# Patient Record
Sex: Female | Born: 1950 | Race: White | Hispanic: No | Marital: Married | Smoking: Former smoker
Health system: Southern US, Community
[De-identification: ages and names within clinical notes are randomized; demographics above are authoritative.]

## PROBLEM LIST (undated history)

## (undated) DIAGNOSIS — M797 Fibromyalgia: Secondary | ICD-10-CM

## (undated) DIAGNOSIS — Z8719 Personal history of other diseases of the digestive system: Secondary | ICD-10-CM

## (undated) DIAGNOSIS — F32A Depression, unspecified: Secondary | ICD-10-CM

## (undated) DIAGNOSIS — E0500 Thyrotoxicosis with diffuse goiter without thyrotoxic crisis or storm: Secondary | ICD-10-CM

## (undated) DIAGNOSIS — E669 Obesity, unspecified: Secondary | ICD-10-CM

## (undated) DIAGNOSIS — F329 Major depressive disorder, single episode, unspecified: Secondary | ICD-10-CM

## (undated) DIAGNOSIS — I499 Cardiac arrhythmia, unspecified: Secondary | ICD-10-CM

## (undated) DIAGNOSIS — IMO0001 Reserved for inherently not codable concepts without codable children: Secondary | ICD-10-CM

## (undated) DIAGNOSIS — I10 Essential (primary) hypertension: Secondary | ICD-10-CM

## (undated) DIAGNOSIS — R7303 Prediabetes: Secondary | ICD-10-CM

## (undated) DIAGNOSIS — N390 Urinary tract infection, site not specified: Secondary | ICD-10-CM

## (undated) DIAGNOSIS — I639 Cerebral infarction, unspecified: Secondary | ICD-10-CM

## (undated) DIAGNOSIS — A809 Acute poliomyelitis, unspecified: Secondary | ICD-10-CM

## (undated) DIAGNOSIS — G4730 Sleep apnea, unspecified: Secondary | ICD-10-CM

## (undated) DIAGNOSIS — E039 Hypothyroidism, unspecified: Secondary | ICD-10-CM

## (undated) DIAGNOSIS — J45909 Unspecified asthma, uncomplicated: Secondary | ICD-10-CM

## (undated) DIAGNOSIS — E7800 Pure hypercholesterolemia, unspecified: Secondary | ICD-10-CM

## (undated) DIAGNOSIS — F419 Anxiety disorder, unspecified: Secondary | ICD-10-CM

## (undated) HISTORY — DX: Thyrotoxicosis with diffuse goiter without thyrotoxic crisis or storm: E05.00

## (undated) HISTORY — PX: ABDOMINAL HYSTERECTOMY: SHX81

## (undated) HISTORY — PX: REPLACEMENT TOTAL KNEE: SUR1224

## (undated) HISTORY — DX: Personal history of other diseases of the digestive system: Z87.19

## (undated) HISTORY — PX: APPENDECTOMY: SHX54

## (undated) HISTORY — PX: KNEE ARTHROPLASTY: SHX992

## (undated) HISTORY — PX: BREAST SURGERY: SHX581

## (undated) HISTORY — DX: Unspecified asthma, uncomplicated: J45.909

## (undated) HISTORY — PX: TOTAL THYROIDECTOMY: SHX2547

## (undated) HISTORY — PX: CHOLECYSTECTOMY: SHX55

## (undated) HISTORY — DX: Fibromyalgia: M79.7

## (undated) HISTORY — DX: Acute poliomyelitis, unspecified: A80.9

## (undated) HISTORY — PX: URETHRAL CYST REMOVAL: SHX5128

## (undated) HISTORY — DX: Pure hypercholesterolemia, unspecified: E78.00

---

## 2000-01-29 ENCOUNTER — Encounter: Admission: RE | Admit: 2000-01-29 | Discharge: 2000-04-28 | Payer: Self-pay | Admitting: Anesthesiology

## 2001-07-06 ENCOUNTER — Encounter: Payer: Self-pay | Admitting: Specialist

## 2001-07-06 ENCOUNTER — Ambulatory Visit (HOSPITAL_COMMUNITY): Admission: RE | Admit: 2001-07-06 | Discharge: 2001-07-06 | Payer: Self-pay | Admitting: Specialist

## 2001-07-14 ENCOUNTER — Encounter: Payer: Self-pay | Admitting: Specialist

## 2001-07-16 ENCOUNTER — Ambulatory Visit (HOSPITAL_COMMUNITY): Admission: RE | Admit: 2001-07-16 | Discharge: 2001-07-16 | Payer: Self-pay | Admitting: Specialist

## 2004-07-30 ENCOUNTER — Ambulatory Visit (HOSPITAL_COMMUNITY): Admission: RE | Admit: 2004-07-30 | Discharge: 2004-07-30 | Payer: Self-pay | Admitting: General Surgery

## 2005-05-10 ENCOUNTER — Ambulatory Visit (HOSPITAL_COMMUNITY): Admission: RE | Admit: 2005-05-10 | Discharge: 2005-05-10 | Payer: Self-pay | Admitting: *Deleted

## 2005-06-17 ENCOUNTER — Inpatient Hospital Stay (HOSPITAL_COMMUNITY): Admission: RE | Admit: 2005-06-17 | Discharge: 2005-06-20 | Payer: Self-pay | Admitting: Orthopedic Surgery

## 2005-07-08 ENCOUNTER — Encounter: Admission: RE | Admit: 2005-07-08 | Discharge: 2005-08-04 | Payer: Self-pay | Admitting: Orthopedic Surgery

## 2005-08-05 ENCOUNTER — Encounter: Admission: RE | Admit: 2005-08-05 | Discharge: 2005-08-05 | Payer: Self-pay | Admitting: Orthopedic Surgery

## 2007-07-21 ENCOUNTER — Encounter (HOSPITAL_COMMUNITY): Admission: RE | Admit: 2007-07-21 | Discharge: 2007-08-20 | Payer: Self-pay | Admitting: Family Medicine

## 2007-11-11 ENCOUNTER — Encounter (HOSPITAL_COMMUNITY): Admission: RE | Admit: 2007-11-11 | Discharge: 2007-12-11 | Payer: Self-pay | Admitting: Endocrinology

## 2009-06-26 ENCOUNTER — Encounter: Admission: RE | Admit: 2009-06-26 | Discharge: 2009-06-26 | Payer: Self-pay | Admitting: Orthopedic Surgery

## 2009-07-02 ENCOUNTER — Encounter: Admission: RE | Admit: 2009-07-02 | Discharge: 2009-07-02 | Payer: Self-pay | Admitting: Orthopedic Surgery

## 2010-06-01 NOTE — Procedures (Signed)
[facility]  Patient:    Michelle Roth, Michelle Roth                       MRN: [id number] Proc. Date: 02/11/00 Adm. Date:  [id number] Attending:  Thyra Breed CC:         Reinaldo Meeker, M.D.  Patrica Duel, M.D.   Procedure Report  PROCEDURE:  Lumbar epidural steroid injection.  DIAGNOSIS:  Lumbar radiculopathy with underlying lumbar degenerative disk disease and facet joint arthropathy.  INTERVAL HISTORY:  The patients noted that her back discomfort is improving with her first injection. She rates her pain at 6/10. She tolerated the injection well but did develop a global sense of weakness following the injection, and since her first injection has noted a cystic swelling over the dorsum of her left foot.  PHYSICAL EXAMINATION:  Blood pressure 128/55, heart rate 77, respiratory rate 18, O2 saturations 98%, and pain level is 6/10. She shows good healing over her previous injection site. She appears to have a nodular cystic type swelling over the dorsum of her left foot suggestive of a ganglion cyst.  DESCRIPTION OF PROCEDURE:  After informed consent was obtained, the patient was placed in the sitting position and monitored. The patients back was prepped with Betadine x 3. A skin wheal was raised at the L4-5 interspace with 1 percent lidocaine. A 20 gauge Tuohy needle was introduced to the lumbar epidural space to loss of resistance to preservative free normal saline. There was no cerebrospinal fluid nor blood. 40 mg of Medrol and 6 ml of preservative free normal saline was gently injected. The needle was flushed with preservative free normal saline and removed intact.  CONDITION POST PROCEDURE:  Stable.  DISCHARGE INSTRUCTIONS:  Resume previous diet. Limitations in activities per instruction sheet. Continue on current medications. Follow-up with me in 1-2 weeks for a third injection. DD:  02/11/00 TD:  02/11/00 Job: [id number]

## 2010-06-01 NOTE — Consult Note (Signed)
[facility]  Patient:    Michelle Roth, Michelle Roth                       MRN: [id number] Proc. Date: 03/19/00 Adm. Date:  [id number] Attending:  Thyra Breed CC:         Dr. Elisabeth Most, M.D.                          Consultation Report  FOLLOW-UP EVALUATION:  Michelle Roth comes in for a follow-up evaluation today. She saw Dr. Gerlene Fee earlier who recommended surgical intervention since she has not had any lasting benefits from the block therapy. I advised her that she has had less than a month worth of benefit from the intra-articular facet blocks and it likely is not going to hold and that she should seriously consider surgical intervention. She is quite adamant about not pursuing this. She tells me that her insurance will be running out and she is reluctant to proceed with this. She is nervous and she states she has a catch in her left hip. She has had her Xanax increased and her Zoloft since her last evaluation. She rates her pain at 6/10. She is very anxious and unsettled today. She complains of pain predominantly in the left buttock and thigh region. It is made worse by standing, bending, walking, sitting in the same position for more than five minutes and improved by lying on her left side with a pillow between her legs.  CURRENT MEDICATIONS:  Zoloft 150 mg per day. Xanax 1 mg q. 6h. Ziac 2.5 mg per day. Zantac, Alphagan eyedrops, Travatan eyedrops.  PHYSICAL EXAMINATION:  Blood pressure 155/94, heart rate is 93, respiratory rate 18, O2 saturations 97%, pain level is 6/10. The patient is very anxious today and fidgety. Hyperextension of her back to 15 degrees increases the discomfort on the left lower facet regions, forward flexion to almost 90 degrees reduces her discomfort. Straight leg raise signs are negative. Deep tendon reflexes were 1+ a the knees, 2+ at the ankles.  IMPRESSION: 1. Low back pain with lumbar radiculopathy  with underlying lumbar spondylosis    and degenerative disk disease nonresponsive to epidural steroid injection    with really limited response to intra-articular facet joint injection. 2. Other medical problems per Dr. Nobie Putnam.  DISPOSITION: 1. I advised the patient that she should seriously reconsider surgical    intervention since the blocks have not been of great benefit. As an    alternative, I advised her she could try Ultram 50 mg one p.o. to    two p.o. q. 6h p.r.n. She was given a prescription for 100 with four    refills. She was made aware of the side effects of this. In addition, she    could try apple cider vinegar with honey since she is allergic to the    aspirin products with asthma and hives. 2. I advised her that she might benefit from doing Robin McKinseys exercises    and gave her a list of books on these. 3. I offered to see her in follow-up but she is worried about the cost so I    I left it open. She is more than welcome to return to [place] but I really    encouraged her to seriously consider surgical intervention. She stated    she  wants a 100% guarantee that the surgery will be of some benefit and    I advised her that nobody could do that. She stated that she is in the    process of applying for disability.DD:  03/19/00 TD:  03/20/00 Job: [id number]

## 2010-06-01 NOTE — H&P (Signed)
NAME:  Michelle Roth, Michelle Roth                ACCOUNT NO.:  [id number]   MEDICAL RECORD NO.:  [id number]           PATIENT TYPE:  AMB   LOCATION:                                FACILITY:  [facility]   PHYSICIAN:  Dalia Heading, M.D.  DATE OF BIRTH:  [birth year]   DATE OF ADMISSION:  DATE OF DISCHARGE:  LH                                HISTORY & PHYSICAL   CHIEF COMPLAINT:  Need for screening colonoscopy.   HISTORY OF PRESENT ILLNESS:  The patient is a 60 year old white female who  is referred for endoscopic evaluation. She needs colonoscopy for screening  purposes. No abdominal pain, weight loss, nausea, vomiting, diarrhea,  constipation, melena, or hematochezia has been noted.   PAST MEDICAL HISTORY:  Depression.   PAST SURGICAL HISTORY:  1.  Hysterectomy.  2.  Urethral cyst removal.  3.  Left breast biopsy.  4.  Cholecystectomy.   CURRENT MEDICATIONS:  Xanax, Zoloft, Ziac, Aleve, tramadol.   ALLERGIES:  ASPIRIN, BIAXIN.   REVIEW OF SYSTEMS:  Noncontributory.   PHYSICAL EXAMINATION:  GENERAL:  The patient is a well-developed, well-  nourished, white female in no acute distress. She is afebrile and vital  signs are stable.  LUNGS:  Clear to auscultation with equal breath sounds bilaterally.  HEART:  Reveals a regular rate and rhythm without S3, S4, or murmurs.  ABDOMEN:  Soft, nontender, nondistended. No hepatosplenomegaly or masses are  noted.  RECTAL:  Deferred to the procedure.   IMPRESSION:  Need for screening colonoscopy.   PLAN:  The patient is scheduled for colonoscopy on July 30, 2004. The risks  and benefits of the procedure including bleeding and perforation were fully  explained to the patient who gave informed consent.       MAJ/MEDQ  D:  07/26/2004  T:  07/26/2004  Job:  [id number]   cc:   [facility]  Fax: [phone]   Patrica Duel, M.D.  [address], [address]  [place]  [place] [address]  Fax: [phone]

## 2010-06-01 NOTE — Op Note (Signed)
[facility]  Patient:    Michelle Roth, Michelle Roth                       MRN: [id number] Proc. Date: 02/21/00 Adm. Date:  [id number] Attending:  Thyra Breed CC:         Patrica Duel, M.D.  Reinaldo Meeker, M.D.   Operative Report  PROCEDURE:  Facet joint injections at L3-4, L4-5, and L5-S1 on the left side.  DIAGNOSIS:  Lumbar spondylosis with underlying low back discomfort with a facet joint syndrome.  ANESTHESIOLOGIST:  Thyra Breed, M.D.  INTERVAL HISTORY: The patient has noted no improvement with epidurals x 2. She spoke with Dr. Gerlene Fee yesterday, and he encouraged her to go ahead and proceed with facet joint block.  She presents today for that.  I spoke with her about it on her last visit and reviewed the potential risks and benefits of it.  PHYSICAL EXAMINATION:  VITAL SIGNS:  Blood pressure 164/85, heart rate 75, respiratory rate 20, O2 saturation 97%, pain level 5/10.  GENERAL:  Her exam is unchanged from her last visit.  PROCEDURE:  After informed consent was obtained, the patient was placed on the fluoroscopy table in the prone position with a pillow under her abdomen. Monitors were placed.  Using fluoroscopic guidance, I optimized visualization of the L3-4, L4-5, and L5-S1 facet joints on the left side.  The skin overlying these areas was marked and prepped with Betadine x 3.  I draped out the area.  Using a 25-gauge needle, I anesthetized the skin and subcutaneous structures with 2 cc of 1% lidocaine at each level, and 22-gauge Chiba needles were introduced down to the facet joints at each level confirmed by AP, lateral, and oblique projections.   Aspiration was negative.  I injected 0.5 cc of 1% lidocaine at each level.  There was no evidence of a spinal block.  Medrol 13 mg and 0.5 cc of 1% lidocaine was injected at each level, and the needles were flushed with preservative free normal saline.  The needle was removed intact at each  level.  Fifteen minutes after the procedure, the patient noted marked reduction in her pain.  She stated that it was brought on by bending over and lifting up objects, so I placed a can on the floor.  She bent over and lifted it up and stated that she was markedly improved.  I advised the patient that it appears that most of her discomfort is coming from these three facet joints.  I advised her that I would see her back in followup in four weeks.  If she was having recurrence of her pain, we would go ahead and do nerve blocks to the facet joints with consideration for possible radiofrequency lesioning of these nerves versus a repeat intra-articular injection.  She is to continue on her current medications. DD:  02/21/00 TD:  02/22/00 Job: [id number]

## 2010-06-01 NOTE — Op Note (Signed)
[facility]  Patient:    Michelle Roth, Michelle Roth Visit Number: [id number] MRN: [id number]          Service Type: DSU Location: DAY Attending Physician:  Rocky Crafts Dictated by:   Michael Litter. Montez Morita, M.D. Proc. Date: 07/16/01 Admit Date:  07/16/2001 Discharge Date: 07/16/2001                             Operative Report  PREOPERATIVE DIAGNOSIS:  Torn medial meniscus with degenerative arthritis, right knee.  POSTOPERATIVE DIAGNOSIS:  Torn medial meniscus with degenerative arthritis, right knee.  OPERATION PERFORMED:  Partial medial meniscectomy.  PROCEDURE:  After suitable general anesthesia, the upper thigh tourniquet was inflated to 400 mmHg and then she is placed into an arthroscopy leg holder and prepped and draped routinely. Arthroscopy is carried out with an anterolateral portal introducing the scope. Once into the joint, the fluid is hooked up to it and introduced up into the suprapatellar pouch. The pouch revealed some mild chondromalacia. Coming to the medial joint, there is extensive chondromalacia degenerative changes of the medial femoral condyle and medial tibial plateau and a significant tearing of the medial meniscus. It is a vertical cleavage tear with secondary tears on either side. This is then trimmed back with a combination of 3.5, 4.2 rotary meniscotomes and a straight punch to a nice smooth edge of meniscus. At the end of the procedure, 20 cc of 0.5% Marcaine with adrenaline into the knee. The two puncture wounds were sutured with nylon and compression dressing. The tourniquet was released and she goes to recovery in good condition. Dictated by:   [employer] Montez Morita, M.D. Attending Physician:  Rocky Crafts DD:  07/16/01 TD:  07/20/01 Job: [id number]

## 2010-06-01 NOTE — H&P (Signed)
[facility]  Patient:    Michelle Roth, Michelle Roth                       MRN: [id number] Adm. Date:  [id number] Attending:  Thyra Breed CC:         Reinaldo Meeker, M.D.  Dr. _________   History and Physical  FOLLOW-UP EVALUATION:  Nasreen comes in for follow-up evaluation of her chronic low back pain. She has had two lumbar epidural steroid injections and notes no overall improvement. She has not been back to see Reinaldo Meeker, M.D. yet. She complains of pain predominantly in the lower back and to a lesser extent the numbness and tingling out into the left lower extremity. She notes with activities this is increased.  PHYSICAL EXAMINATION:  VITAL SIGNS:  Blood pressure 139/52, heart rate is 74, respiratory rate is 20, O2 saturation is 96%, temperature is 97.6, pain level is 5/10.  NEUROLOGICAL:  Her deep tendon reflexes in the lower extremities revealed 2+ at the right knee, 0 to 1+ at the left knee, 2+ at the ankles with negative straight leg raise signs. She exhibited increased discomfort in her left lower lumbar regions to hyperextension of her back but reduced with forward flexion.  IMPRESSION: 1. Low back pain with underlying lumbar radiculopathy into the left lower    extremity with underlying lumbar spondylosis and degenerative disk disease    with no improvement to epidural steroid injections. 2. Other medical problems per Dr. ______ .  DISPOSITION: 1. I encouraged the patient to follow up with Reinaldo Meeker, M.D. to find    out whether he was going to do any further diagnostic studies. 2. I offered facet joint injections to the patient if Reinaldo Meeker, M.D.    concurs and will see her back at Dr. Allyson Sabal request. DD:  02/18/00 TD:  02/18/00 Job: [id number]

## 2010-06-01 NOTE — Discharge Summary (Signed)
Michelle Roth, Michelle Roth                ACCOUNT NO.:  [id number]   MEDICAL RECORD NO.:  [id number]          PATIENT TYPE:  INP   LOCATION:  1504                         FACILITY:  [facility]   PHYSICIAN:  Ollen Gross, M.D.    DATE OF BIRTH:  [birth year]   DATE OF ADMISSION:  06/17/2005  DATE OF DISCHARGE:  06/20/2005                                 DISCHARGE SUMMARY   ADMISSION DIAGNOSES:  1. Osteoarthritis, left knee.  2. History of irritable bowel syndrome.  3. History of panic disorder.  4. Anxiety.  5. History of asthma.  6. History of bronchitis.  7. Hypertension.  8. Hiatal hernia.  9. Gastroesophageal reflux disease.  10.History of urinary tract infections.  11.History of skin cancer.  12.Degenerative disk disease.  13.History of hypoglycemia.  14.Obesity.  15.Hemorrhoids.   DISCHARGE DIAGNOSES:  1. Osteoarthritis, left knee, status post left total knee angioplasty.  2. Acute blood loss anemia, did not require transfusion.  3. History of irritable bowel syndrome.  4. History of panic disorder.  5. Anxiety.  6. History of asthma.  7. History of bronchitis.  8. Hypertension.  9. Hiatal hernia.  10.Gastroesophageal reflux disease.  11.History of urinary tract infections.  12.History of skin cancer.  13.Degenerative disk disease.  14.History of hypoglycemia.  15.Obesity.  16.Hemorrhoids.   PROCEDURES:  June 17, 2005, left total knee angioplasty.  Surgeon: Ollen Gross, M.D.  Assistance: Alexzandrew L. Perkins, P.A.-C.  Anesthesia:  General.  Postop Marcaine pain pump.   CONSULTATIONS:  None.   BRIEF HISTORY:  Michelle Roth is a 60 year old female with severe end-stage  osteoarthritis of the left knee with intractable pain, failed conservative  management, now presents for total knee.   LABORATORY DATA:  Preop CBC: Hemoglobin 12.5, hematocrit 37.5, white cell  count 6.2.  Postop hemoglobin 10.7, dropped down to 9.9.  Last hemoglobin  9.4, hematocrit 27.  PT/PTT 13.2  and 29, respectively, INR 1.0.  Serial pro  times followed.  PT 20.5, INR 1.7.  Chemistries on admission all within  normal limits with the exception of albumin of 3.4.  Serial albumin's were  followed.  Electrolytes remained within normal limits.  Preop UA: Moderate  leukocyte esterase, rare epithelials, 11-20 white cells, trace hemoglobin  treated perioperatively.  Blood type A positive.   EKG dated May 10, 2005: Sinus rhythm, unable to read signature.   HOSPITAL COURSE:  Admitted to [facility]  Tolerated procedure  well.  Later transferred to the recovery room then orthopedic floor. Given  24 hours postop IV antibiotic, started on PCA for pain control following  surgery and supplemented with p.o. medications, and given Marcaine pain  pump.  On the morning of day #1, was doing pretty well, although not getting  much sleep.  Hemovac drain placed during surgery was pulled on day #1.   Started to get her out of bed with physical therapy by day #2.  The patient  was doing a little bit better.  She had some intermittent nausea on the  evening of day #1 which was improved by day #2.  Dressing was changed;  incision looked good.  Hemoglobin dropped down to 9.5, but she was  asymptomatic with it.  Started getting up with physical therapy and  ambulating 200 feet that day.  Was doing so well by the following day, she  continued with her therapy, and was discharged home on June 20, 2005.   DISCHARGE PLAN:  Discharged home on June 20, 2005   For Discharge Diagnoses, please see above.   DISCHARGE MEDICATIONS:  Coumadin, Vicodin, Robaxin, and 1 Lovenox dose prior  to discharge.   DIET:  Regular.   FOLLOW UP:  In 2 weeks.   ACTIVITY:  Home health PT, home health nursing with total knee protocol.   DISPOSITION:  Home.   CONDITION ON DISCHARGE:  Improved.      Alexzandrew L. Julien Girt, P.A.      Ollen Gross, M.D.  Electronically Signed    ALP/MEDQ  D:  08/13/2005  T:   08/13/2005  Job:  [id number]   cc:   Patrica Duel, M.D.  Fax: [phone]   Dani Gobble, MD  Fax: [phone]

## 2010-06-01 NOTE — Op Note (Signed)
[facility]  Patient:    Michelle Roth, Michelle Roth                       MRN: [id number] Proc. Date: 01/30/00 Adm. Date:  [id number] Attending:  Thyra Breed CC:         Reinaldo Meeker, M.D.  Patrica Duel, M.D.   Operative Report  PROCEDURE:  Lumbar epidural steroid injection.  DIAGNOSES:  Lumbar radiculopathy with underlying lumbar degenerative disk disease and facet joint arthritis.  ANESTHESIOLOGIST:  Thyra Breed, M.D.  HISTORY:  Michelle Roth is a very pleasant 60 year old who was sent to [place] by Dr. Aliene Beams for a series of two lumbar epidural steroid injections.  The patient stated that she was in her usual state of health up until about 10 years ago when she was lifting a 5-gallon bucket of water and noted a pop in her back.  She was treated by a chiropractor, Dr. Rhoderick Moody, in [place] and did well.  Approximately two years ago, she noted the insidious onset of numbness and tingling to her left lower extremity and lower back discomfort which she characterizes as sharp discomfort which eventually evolves into a throbbing, burning pain radiating out to the left lower extremity along the hip and into the anterior thigh.  It is made worse by activity and improved by rest, especially lying down with her body rolled into a fetal position with pillows between her knees.  She was initially treated for this by a chiropractor in [place], Dr. Lawerance Sabal, with no improvement and eventually saw Dr. ______ but had no sustained improvement.  She saw Dr. Patrica Duel in [place], her primary care physician, who obtained an MRI and sent her to Dr. Gerlene Fee.  Dr. Gerlene Fee has recommended that she go through a series of epidurals.  She does note the numbness and tingling as outlined above.  She has intermittent weakness of her left lower extremity but denied any bowel or bladder incontinence.  Her pain is made worse by exercising, walking, sitting, standing, or  palpation over her back.  It is sometimes helped by ice and sleep.  She is currently on Zoloft 100 mg per day, Xanax 1 mg t.i.d. p.r.n. for panic disorder, hydrochlorothiazide, Alphagan and bisoprolol for glaucoma, and Zantac for gastroesophageal reflux.  ALLERGIES:  ASPIRIN and NONSTEROIDALS.  FAMILY HISTORY:  Positive for osteoarthritis.  PAST SURGICAL HISTORY:  Significant for hysterectomy, periurethral cyst resection, gallbladder surgery with appendectomy, and lumpectomy.  ACTIVE MEDICAL PROBLEMS:  Include panic disorder, glaucoma, gastroesophageal reflux disease, irritable bowel syndrome, asthma, and dust mite allergy.  REVIEW OF SYSTEMS:  General: Negative.  Head: Significant for tension headaches.   Eyes: Significant for glaucoma.  Nose, Mouth, Throat: Significant for allergies. Ears: Negative.  Pulmonary: Significant for asthma. Cardiovascular: Negative.  GI: See Active Medical Problems.   GU: Negative. Musculoskeletal: See HPI.  The patient also has some shoulder and knee discomfort.  Neurologic: See HPI.  No history of seizure or stroke. Cutaneous: Negative.  Hematologic: Negative.  Endocrine: Negative. Psychiatric: Significant for panic disorder.  Allergy and Immunologic: Positive for dust mite allergy.  PHYSICAL EXAMINATION:  VITAL SIGNS:  Blood pressure 152/73, heart rate 69, respiratory rate 18, O2 saturation 99%.  Pain level is 7/10.  Temperature 97.9.  GENERAL:  This is a pleasant female in no acute distress.  HEENT:  Head was normocephalic, atraumatic.  Eyes: Extraocular movements intact with conjunctivae and sclerae clear.  Nose: Patent nares without discharge.  Oropharynx free of lesions.  NECK:  Supple without lymphadenopathy.  Carotids 2+ and symmetric without bruits.  LUNGS:  Clear.  HEART:  Regular rate and rhythm.  BREASTS/ABDOMEN/PELVIC/RECTAL:  Exams not performed.  BACK:  Minimal tenderness to palpation over her lower back.  Straight leg raise  signs were negative.  EXTREMITIES:   Radial pulses and dorsalis pedis pulses 2+ and symmetric.  She has some mild varicosities of lower extremities.  NEUROLOGIC:  The patient is oriented x 4.  Cranial nerves II-XII grossly intact.  Deep tendon reflexes were symmetric in the upper extremity, 2+ at the right knee, 0 to 1+ at the left knee, 2+ at the ankles with downgoing toes. Motor was 5/5 with symmetric bulk and tone.  Coordination was grossly intact. Sensory was intact to pinprick, light touch, and vibratory sense.  IMPRESSION: 1. Low back pain with lumbar radiculopathy into the left lower extremity with    underlying lumbar spondylosis and degenerative disk disease.  I do not have    a copy of the interpretation of her MRI or her MRI for review. 2. Other medical problems per Dr. Nobie Putnam which include gastroesophageal    reflux disease, irritable bowel syndrome, glaucoma, panic attacks, dust    mite allergy, and asthma.  DISPOSITION:  I discussed the potential benefits, limitations, and alternatives to an epidural steroid injection with the patient as well as with her husband.  Their questions were answered.  PROCEDURE:  After informed consent was obtained, the patient was placed in the sitting position and monitored.  Her back was prepped with Betadine x 3.  A skin wheal was raised at the L4-5 interspace with 1% lidocaine.  A 20-gauge Tuohy needle was introduced in the lumbar epidural space to loss of resistance to preservative free normal saline.  There was no CSF nor blood.  Medrol 40 mg and 8 ml preservative free normal saline was gently injected.  The needle was flushed with preservative free normal saline and removed intact.  POSTPROCEDURE CONDITION:  Stable.  DISCHARGE INSTRUCTIONS: 1. Resume previous diet. 2. Limitation of activities per instruction sheet. 3. Continue on current medications.  4. Follow up with me in one to two weeks for repeat injection.DD:   01/30/00 TD:  01/31/00 Job: [id number]

## 2010-06-01 NOTE — H&P (Signed)
NAME:  Michelle Roth, Michelle Roth                ACCOUNT NO.:  [id number]   MEDICAL RECORD NO.:  [id number]          PATIENT TYPE:  INP   LOCATION:  NA                           FACILITY:  [facility]   PHYSICIAN:  Ollen Gross, M.D.    DATE OF BIRTH:  07/28/1950   DATE OF ADMISSION:  06/17/2005  DATE OF DISCHARGE:                                HISTORY & PHYSICAL   DATE OF OFFICE VISIT HISTORY AND PHYSICAL:  Jun 11, 2005   CHIEF COMPLAINT:  Left knee pain.   HISTORY OF PRESENT ILLNESS:  The patient is a 60 year old female who has  been seen by Dr. Lequita Halt for ongoing left knee pain.  She has been treated  by Dr. Francena Hanly in the past with an arthroscopy and did not help very  much with her pain; she has had pain for several years now.  She has  undergone Hyalgan injections which did not help; her knee is hurting her  more and more, even at night.  She is at a stage now where she would like to  have something done about it.  X-rays show end-stage arthritis.  She is felt  it would benefit by undergoing knee replacement, risks and benefits  discussed; the patient is subsequently admitted to the hospital.   ALLERGIES:  BIAXIN causes thrush.  ASPIRIN causes hives.  Questionable  reaction to CORTISONE SHOT, which caused hives.   CURRENT MEDICATIONS:  Xanax, Zoloft, Ziac, Aleve, tramadol, Desyrel.   PAST MEDICAL HISTORY:  1.  Anxiety.  2.  History of panic disorder.  3.  History of asthma.  4.  History of bronchitis.  5.  History of hypertension.  6.  Hiatal hernia.  7.  Reflux disease.  8.  History of hemorrhoids.  9.  History of UTIs.  10. Skin cancer.  11. Degenerative disk disease.  12. Hypoglycemia.  13. She has also had a vagal nerve response which sounds like a hypotensive      episode following colonoscopy.   PAST SURGICAL HISTORY:  1.  Hysterectomy.  2.  Periurethral cyst removed.  3.  Left breast cyst excision.  4.  Gallbladder.  5.  Colonoscopy.  6.  Excision of skin  cancer.   SOCIAL HISTORY:  Married, retired, on disability, nonsmoker, no alcohol, 2  children.  Husband, mother and son will be assisting with care after  surgery.   REVIEW OF SYSTEMS:  GENERAL:  No fevers, chills or night sweats.  NEUROLOGIC:  She does have anxiety and panic disorder.  No seizure, syncope  or paralysis.  RESPIRATORY:  She does get a little shortness of breath on  exertion, but no shortness of breath at rest, no productive cough or  hemoptysis.  CARDIOVASCULAR:  No chest pain, angina or orthopnea.  GI:  No  nausea, vomiting, diarrhea or constipation.  She does have history of IBS.  GU:  No history of hematuria or discharge.  MUSCULOSKELETAL:  Left knee.   PHYSICAL EXAMINATION:  VITAL SIGNS:  Pulse 84, respirations 12, blood  pressure 162/95.  GENERAL:  [age]-year-old white female, well-nourished, well-developed,  obese/overweight, in no acute distress, awake, alert, oriented and  cooperative, very pleasant, accompanied by her husband.  HEENT:  Normocephalic, atraumatic.  Pupils round and reactive.  No glasses.  EOMs intact.  NECK:  Supple.  No carotid bruits.  CHEST:  Clear, anterior and posterior chest walls.  HEART:  Regular rate and rhythm, no murmur, S1 and S2.  ABDOMEN:  Soft, round, protuberant abdomen.  Bowel sounds present and  reactive.  GENITALIA:  Not done, not pertinent to present illness.  EXTREMITIES:  Left knee:  Varus deformity, malalignment and marked crepitus  are noted.  Range of motion of 10-100 degrees, no instability.   IMPRESSION:  1.  Osteoarthritis, left knee.  2.  History of irritable bowel syndrome.  3.  History of panic disorder.  4.  Anxiety.  5.  History of asthma.  6.  History of bronchitis.  7.  Hypertension.  8.  Hiatal hernia.  9.  Gastroesophageal reflux disease.  10. History of urinary tract infections.  11. History of skin cancer.  12. Degenerative disk disease.  13. History of hypoglycemia.  14. Obesity.  15.  Hemorrhoids.   PLAN:  The patient will be admitted to [facility] to undergo a  left total knee arthroplasty; surgery will be performed by Dr. Ollen Gross.  She has been seen by Dr. Kem Boroughs preoperatively and  undergone a recent stress test.  Please note at the time of this dictation  that the stress test results and clearance were pending.      Alexzandrew L. Julien Girt, P.A.      Ollen Gross, M.D.  Electronically Signed    ALP/MEDQ  D:  06/16/2005  T:  06/17/2005  Job:  [id number]   cc:   Patrica Duel, M.D.  Fax: [phone]   Dani Gobble, MD  Fax: [phone]

## 2010-06-01 NOTE — Op Note (Signed)
NAME:  Michelle Roth, Michelle Roth                ACCOUNT NO.:  [id number]   MEDICAL RECORD NO.:  [id number]          PATIENT TYPE:  INP   LOCATION:  1504                         FACILITY:  [facility]   PHYSICIAN:  Ollen Gross, M.D.    DATE OF BIRTH:  30-Dec-1950   DATE OF PROCEDURE:  06/17/2005  DATE OF DISCHARGE:                                 OPERATIVE REPORT   PREOPERATIVE DIAGNOSIS:  Osteoarthritis left knee.   POSTOPERATIVE DIAGNOSIS:  Osteoarthritis left knee.   PROCEDURE:  Left total knee arthroplasty.   SURGEON:  Ollen Gross, M.D.   ASSISTANT:  Alexzandrew L. Julien Girt, P.A.   ANESTHESIA:  General with postop Marcaine pain pump.   ESTIMATED BLOOD LOSS:  Minimal.   DRAIN:  Hemovac x1.   TOURNIQUET TIME:  51 minutes at 300 mmHg.   COMPLICATIONS:  None.   CONDITION:  Stable to recovery room.   CLINICAL NOTE:  Ms. Dickison is a 60 year old female with severe end-stage  osteoarthritis of the left knee with intractable pain.  She has failed  nonoperative management and presents for total knee arthroplasty.   PROCEDURE IN DETAIL:  After successful administration of general anesthetic,  a tourniquet placed on the left thigh and left lower extremity prepped and  draped in usual sterile fashion.  Extremity was wrapped in Esmarch, knee  flexed, tourniquet inflated to 300 mmHg.  Midline incision made with 10  blade through subcutaneous tissue which is a very thick layer down to the  level of the extensor mechanism.  A fresh blade is used to make a medial  parapatellar arthrotomy.  The soft tissue of the proximal medial tibia is  subperiosteally elevated at the joint line with the knife and into the  semimembranosus bursa with a Cobb elevator.  Soft tissue of the proximal  lateral tibia is elevated with attention being paid to avoid the patellar  tendon on tibial tubercle.  Patella was everted, knee flexed 90 degrees,  ACL, PCL removed.  Drill was used to create a starting hole in the  distal  femur and canal was thoroughly irrigated.  5 degree left valgus alignment  guide is placed and referencing off the posterior condyles, rotations  marked, and the block pins were moved 10 mm off the distal femur.  Distal  femoral resection is made with an oscillating saw.  Size 3 is the most  appropriate femoral component and the size 3 cutting block is placed with  rotation being marked off the epicondylar axis.  The anterior, posterior and  chamfer cuts were made.   Tibia subluxed forward and the menisci removed.  Extramedullary tibial  alignment guide is placed referencing proximally at the medial aspect of  tibial tubercle and distally along the second metatarsal axis and tibial  crest.  Blocks pinned to remove approximately 10 mm off the proximal tibia  based off the lateral side.  Tibial resection is made with an oscillating  saw.  Size 3 is the most appropriate tibial component and the proximal tibia  was prepared with the modular drill and keel punch for size 3.  Femoral  preparation is completed the intercondylar cut.   Size 3 mobile bearing tibial trial with a size 3 posterior stabilized  femoral trial and a 10 mm posterior stabilized rotating platform insert  trial placed.  There is slight hyperextension with a 10.  With the 12.5  there is full extension with excellent varus and valgus balance throughout  full range of motion.  Patella was everted, thickness measured to be 22 mm.  Freehand resection taken to 12 mm, 35 template placed, lug holes were  drilled, trial patella was placed and it tracks normally.  There were no  osteophytes to remove from the posterior femur.  All trials removed and the  cut bone surfaces are prepared with pulsatile lavage.  Cement mixed and once  ready for implantation, the size 3 mobile bearing tibial tray, size 3  posterior stabilized femur and 35 patella are cemented in place and patella  was held with the clamp.  Trial 12.5 mm posterior  stabilized rotating  platform insert is placed in tibial tray.  Knee was held in full extension  and all extruded cement removed.  Once cement fully hardened and the  permanent 12.5 mm posterior stabilized rotating platform insert is placed in  tibial tray.  Wound was copiously irrigated with saline solution and the  extensor mechanism closed over Hemovac drain with interrupted #1 PDS.  Flexion against gravity about 110 degrees at which point the calf is hitting  the thigh.  The tourniquet is released with total time of 51 minutes.  Subcu  closed interrupted 2-0 Vicryl, subcuticular running 4-0 Monocryl.  Incisions  cleaned and dried and the catheter for Marcaine pain pump is placed and the  pump initiated.  Steri-Strips and bulky sterile dressing applied.  She is  placed into a knee immobilizer, awakened, transferred to recovery stable  condition.      Ollen Gross, M.D.  Electronically Signed     FA/MEDQ  D:  06/17/2005  T:  06/18/2005  Job:  [id number]

## 2012-06-25 ENCOUNTER — Ambulatory Visit (HOSPITAL_COMMUNITY): Payer: Self-pay | Admitting: Psychology

## 2014-05-03 DIAGNOSIS — H2513 Age-related nuclear cataract, bilateral: Secondary | ICD-10-CM | POA: Diagnosis not present

## 2014-05-06 ENCOUNTER — Encounter (HOSPITAL_COMMUNITY): Payer: Self-pay

## 2014-05-06 ENCOUNTER — Other Ambulatory Visit: Payer: Self-pay

## 2014-05-06 ENCOUNTER — Encounter (HOSPITAL_COMMUNITY)
Admission: RE | Admit: 2014-05-06 | Discharge: 2014-05-06 | Disposition: A | Discharge status: Home / Self Care | Payer: Medicare Other | Source: Ambulatory Visit | Attending: Ophthalmology | Admitting: Ophthalmology

## 2014-05-06 DIAGNOSIS — Z01818 Encounter for other preprocedural examination: Secondary | ICD-10-CM | POA: Diagnosis not present

## 2014-05-06 DIAGNOSIS — H2511 Age-related nuclear cataract, right eye: Secondary | ICD-10-CM | POA: Insufficient documentation

## 2014-05-06 HISTORY — DX: Major depressive disorder, single episode, unspecified: F32.9

## 2014-05-06 HISTORY — DX: Cardiac arrhythmia, unspecified: I49.9

## 2014-05-06 HISTORY — DX: Sleep apnea, unspecified: G47.30

## 2014-05-06 HISTORY — DX: Anxiety disorder, unspecified: F41.9

## 2014-05-06 HISTORY — DX: Depression, unspecified: F32.A

## 2014-05-06 HISTORY — DX: Hypothyroidism, unspecified: E03.9

## 2014-05-06 HISTORY — DX: Reserved for inherently not codable concepts without codable children: IMO0001

## 2014-05-06 HISTORY — DX: Essential (primary) hypertension: I10

## 2014-05-06 LAB — BASIC METABOLIC PANEL
Anion gap: 8 (ref 5–15)
BUN: 14 mg/dL (ref 6–23)
CALCIUM: 9.2 mg/dL (ref 8.4–10.5)
CO2: 30 mmol/L (ref 19–32)
CREATININE: 0.9 mg/dL (ref 0.50–1.10)
Chloride: 100 mmol/L (ref 96–112)
GFR, EST AFRICAN AMERICAN: 76 mL/min — AB (ref >90)
GFR, EST NON AFRICAN AMERICAN: 65 mL/min — AB (ref >90)
Glucose, Bld: 167 mg/dL — ABNORMAL HIGH (ref 70–99)
Potassium: 4.1 mmol/L (ref 3.5–5.1)
SODIUM: 138 mmol/L (ref 135–145)

## 2014-05-06 LAB — CBC
HEMATOCRIT: 37.7 % (ref 36.0–46.0)
HEMOGLOBIN: 12.1 g/dL (ref 12.0–15.0)
MCH: 28.8 pg (ref 26.0–34.0)
MCHC: 32.1 g/dL (ref 30.0–36.0)
MCV: 89.8 fL (ref 78.0–100.0)
Platelets: 181 10*3/uL (ref 150–400)
RBC: 4.2 MIL/uL (ref 3.87–5.11)
RDW: 15.4 % (ref 11.5–15.5)
WBC: 5.6 10*3/uL (ref 4.0–10.5)

## 2014-05-06 NOTE — Patient Instructions (Signed)
Michelle Roth  05/06/2014   Your procedure is scheduled on:  05/10/2014  Report to [facility] at 8:00 AM.  Call this number if you have problems the morning of surgery: [phone]   Remember:   Do not eat food or drink liquids after midnight.   Take these medicines the morning of surgery with A SIP OF WATER: Xanax, Ziac, Hydrocodone, Zoloft, Synthroid   Do not wear jewelry, make-up or nail polish.  Do not wear lotions, powders, or perfumes. You may wear deodorant.  Do not shave 48 hours prior to surgery. Men may shave face and neck.  Do not bring valuables to the hospital.  [facility] is not responsible for any belongings or valuables.               Contacts, dentures or bridgework may not be worn into surgery.  Leave suitcase in the car. After surgery it may be brought to your room.  For patients admitted to the hospital, discharge time is determined by your treatment team.               Patients discharged the day of surgery will not be allowed to drive home.  Name and phone number of your driver:   Special Instructions: N/A   Please read over the following fact sheets that you were given: Anesthesia Post-op Instructions   PATIENT INSTRUCTIONS POST-ANESTHESIA  IMMEDIATELY FOLLOWING SURGERY:  Do not drive or operate machinery for the first twenty four hours after surgery.  Do not make any important decisions for twenty four hours after surgery or while taking narcotic pain medications or sedatives.  If you develop intractable nausea and vomiting or a severe headache please notify your doctor immediately.  FOLLOW-UP:  Please make an appointment with your surgeon as instructed. You do not need to follow up with anesthesia unless specifically instructed to do so.  WOUND CARE INSTRUCTIONS (if applicable):  Keep a dry clean dressing on the anesthesia/puncture wound site if there is drainage.  Once the wound has quit draining you may leave it open to air.  Generally you should leave  the bandage intact for twenty four hours unless there is drainage.  If the epidural site drains for more than 36-48 hours please call the anesthesia department.  QUESTIONS?:  Please feel free to call your physician or the hospital operator if you have any questions, and they will be happy to assist you.      Cataract Surgery  A cataract is a clouding of the lens of the eye. When a lens becomes cloudy, vision is reduced based on the degree and nature of the clouding. Surgery may be needed to improve vision. Surgery removes the cloudy lens and usually replaces it with a substitute lens (intraocular lens, IOL). LET YOUR EYE DOCTOR KNOW ABOUT:  Allergies to food or medicine.  Medicines taken including herbs, eye drops, over-the-counter medicines, and creams.  Use of steroids (by mouth or creams).  Previous problems with anesthetics or numbing medicine.  History of bleeding problems or blood clots.  Previous surgery.  Other health problems, including diabetes and kidney problems.  Possibility of pregnancy, if this applies. RISKS AND COMPLICATIONS  Infection.  Inflammation of the eyeball (endophthalmitis) that can spread to both eyes (sympathetic ophthalmia).  Poor wound healing.  If an IOL is inserted, it can later fall out of proper position. This is very uncommon.  Clouding of the part of your eye that holds an IOL in place. This is  called an "after-cataract." These are uncommon but easily treated. BEFORE THE PROCEDURE  Do not eat or drink anything except small amounts of water for 8 to 12 before your surgery, or as directed by your caregiver.  Unless you are told otherwise, continue any eye drops you have been prescribed.  Talk to your primary caregiver about all other medicines that you take (both prescription and nonprescription). In some cases, you may need to stop or change medicines near the time of your surgery. This is most important if you are taking blood-thinning  medicine.Do not stop medicines unless you are told to do so.  Arrange for someone to drive you to and from the procedure.  Do not put contact lenses in either eye on the day of your surgery. PROCEDURE There is more than one method for safely removing a cataract. Your doctor can explain the differences and help determine which is best for you. Phacoemulsification surgery is the most common form of cataract surgery.  An injection is given behind the eye or eye drops are given to make this a painless procedure.  A small cut (incision) is made on the edge of the clear, dome-shaped surface that covers the front of the eye (cornea).  A tiny probe is painlessly inserted into the eye. This device gives off ultrasound waves that soften and break up the cloudy center of the lens. This makes it easier for the cloudy lens to be removed by suction.  An IOL may be implanted.  The normal lens of the eye is covered by a clear capsule. Part of that capsule is intentionally left in the eye to support the IOL.  Your surgeon may or may not use stitches to close the incision. There are other forms of cataract surgery that require a larger incision and stitches to close the eye. This approach is taken in cases where the doctor feels that the cataract cannot be easily removed using phacoemulsification. AFTER THE PROCEDURE  When an IOL is implanted, it does not need care. It becomes a permanent part of your eye and cannot be seen or felt.  Your doctor will schedule follow-up exams to check on your progress.  Review your other medicines with your doctor to see which can be resumed after surgery.  Use eye drops or take medicine as prescribed by your doctor. Document Released: 12/20/2010 Document Revised: 05/17/2013 Document Reviewed: 12/20/2010 [facility] Patient Information 2015 [place], [place]. This information is not intended to replace advice given to you by your health care provider. Make sure you discuss  any questions you have with your health care provider.

## 2014-05-09 MED ORDER — CYCLOPENTOLATE-PHENYLEPHRINE OP SOLN OPTIME - NO CHARGE
OPHTHALMIC | Status: AC
  Filled 2014-05-09: qty 2

## 2014-05-09 MED ORDER — KETOROLAC TROMETHAMINE 0.5 % OP SOLN
OPHTHALMIC | Status: AC
  Filled 2014-05-09: qty 5

## 2014-05-09 MED ORDER — PHENYLEPHRINE HCL 2.5 % OP SOLN
OPHTHALMIC | Status: AC
  Filled 2014-05-09: qty 15

## 2014-05-09 MED ORDER — TETRACAINE HCL 0.5 % OP SOLN
OPHTHALMIC | Status: AC
  Filled 2014-05-09: qty 2

## 2014-05-10 ENCOUNTER — Ambulatory Visit (HOSPITAL_COMMUNITY): Payer: Medicare Other | Admitting: Anesthesiology

## 2014-05-10 ENCOUNTER — Encounter (HOSPITAL_COMMUNITY): Payer: Self-pay | Admitting: *Deleted

## 2014-05-10 ENCOUNTER — Ambulatory Visit (HOSPITAL_COMMUNITY)
Admission: RE | Admit: 2014-05-10 | Discharge: 2014-05-10 | Disposition: A | Discharge status: Home / Self Care | Payer: Medicare Other | Source: Ambulatory Visit | Attending: Ophthalmology | Admitting: Ophthalmology

## 2014-05-10 ENCOUNTER — Encounter (HOSPITAL_COMMUNITY)
Admission: RE | Disposition: A | Discharge status: Home / Self Care | Payer: Self-pay | Source: Ambulatory Visit | Attending: Ophthalmology

## 2014-05-10 DIAGNOSIS — G4730 Sleep apnea, unspecified: Secondary | ICD-10-CM | POA: Diagnosis not present

## 2014-05-10 DIAGNOSIS — Z79899 Other long term (current) drug therapy: Secondary | ICD-10-CM | POA: Insufficient documentation

## 2014-05-10 DIAGNOSIS — H259 Unspecified age-related cataract: Secondary | ICD-10-CM | POA: Diagnosis not present

## 2014-05-10 DIAGNOSIS — Z87891 Personal history of nicotine dependence: Secondary | ICD-10-CM | POA: Diagnosis not present

## 2014-05-10 DIAGNOSIS — H2512 Age-related nuclear cataract, left eye: Secondary | ICD-10-CM | POA: Diagnosis not present

## 2014-05-10 DIAGNOSIS — I10 Essential (primary) hypertension: Secondary | ICD-10-CM | POA: Diagnosis not present

## 2014-05-10 DIAGNOSIS — E039 Hypothyroidism, unspecified: Secondary | ICD-10-CM | POA: Diagnosis not present

## 2014-05-10 DIAGNOSIS — H2511 Age-related nuclear cataract, right eye: Secondary | ICD-10-CM | POA: Insufficient documentation

## 2014-05-10 HISTORY — PX: CATARACT EXTRACTION W/PHACO: SHX586

## 2014-05-10 LAB — GLUCOSE, CAPILLARY: Glucose-Capillary: 106 mg/dL — ABNORMAL HIGH (ref 70–99)

## 2014-05-10 SURGERY — PHACOEMULSIFICATION, CATARACT, WITH IOL INSERTION
Anesthesia: Monitor Anesthesia Care | Site: Eye | Laterality: Right

## 2014-05-10 MED ORDER — TETRACAINE HCL 0.5 % OP SOLN
1.0000 [drp] | OPHTHALMIC | Status: AC
Start: 2014-05-10 — End: 2014-05-10
  Administered 2014-05-10 (×3): 1 [drp] via OPHTHALMIC

## 2014-05-10 MED ORDER — LACTATED RINGERS IV SOLN
INTRAVENOUS | Status: DC
  Administered 2014-05-10: 09:00:00 via INTRAVENOUS

## 2014-05-10 MED ORDER — MIDAZOLAM HCL 2 MG/2ML IJ SOLN
INTRAMUSCULAR | Status: AC
  Filled 2014-05-10: qty 2

## 2014-05-10 MED ORDER — PHENYLEPHRINE HCL 2.5 % OP SOLN
1.0000 [drp] | OPHTHALMIC | Status: AC
  Administered 2014-05-10 (×3): 1 [drp] via OPHTHALMIC

## 2014-05-10 MED ORDER — EPINEPHRINE HCL 1 MG/ML IJ SOLN
INTRAMUSCULAR | Status: AC
  Filled 2014-05-10: qty 1

## 2014-05-10 MED ORDER — BSS IO SOLN
INTRAOCULAR | Status: DC | PRN
  Administered 2014-05-10: 15 mL

## 2014-05-10 MED ORDER — MIDAZOLAM HCL 2 MG/2ML IJ SOLN
1.0000 mg | INTRAMUSCULAR | Status: DC | PRN
  Administered 2014-05-10: 2 mg via INTRAVENOUS

## 2014-05-10 MED ORDER — PROVISC 10 MG/ML IO SOLN
INTRAOCULAR | Status: DC | PRN
  Administered 2014-05-10: 0.85 mL via INTRAOCULAR

## 2014-05-10 MED ORDER — CYCLOPENTOLATE-PHENYLEPHRINE 0.2-1 % OP SOLN
1.0000 [drp] | OPHTHALMIC | Status: AC
  Administered 2014-05-10 (×3): 1 [drp] via OPHTHALMIC

## 2014-05-10 MED ORDER — MIDAZOLAM HCL 5 MG/5ML IJ SOLN
INTRAMUSCULAR | Status: DC | PRN
  Administered 2014-05-10 (×2): 1 mg via INTRAVENOUS

## 2014-05-10 MED ORDER — FENTANYL CITRATE (PF) 100 MCG/2ML IJ SOLN
25.0000 ug | INTRAMUSCULAR | Status: AC
  Administered 2014-05-10 (×2): 25 ug via INTRAVENOUS

## 2014-05-10 MED ORDER — ONDANSETRON HCL 4 MG/2ML IJ SOLN: 4.0000 mg | Freq: Once | INTRAMUSCULAR | Status: DC | PRN

## 2014-05-10 MED ORDER — FENTANYL CITRATE (PF) 100 MCG/2ML IJ SOLN
INTRAMUSCULAR | Status: AC
  Filled 2014-05-10: qty 2

## 2014-05-10 MED ORDER — MEPERIDINE HCL 50 MG/ML IJ SOLN: 6.2500 mg | Freq: Once | INTRAMUSCULAR | Status: DC

## 2014-05-10 MED ORDER — KETOROLAC TROMETHAMINE 0.5 % OP SOLN
1.0000 [drp] | OPHTHALMIC | Status: AC
  Administered 2014-05-10 (×3): 1 [drp] via OPHTHALMIC

## 2014-05-10 MED ORDER — FENTANYL CITRATE (PF) 100 MCG/2ML IJ SOLN: 25.0000 ug | INTRAMUSCULAR | Status: DC | PRN

## 2014-05-10 MED ORDER — BSS IO SOLN
INTRAOCULAR | Status: DC | PRN
  Administered 2014-05-10: 500 mL

## 2014-05-10 SURGICAL SUPPLY — 9 items
CLOTH BEACON ORANGE TIMEOUT ST (SAFETY) ×3 IMPLANT
EYE SHIELD UNIVERSAL CLEAR (GAUZE/BANDAGES/DRESSINGS) ×3 IMPLANT
GLOVE BIO SURGEON STRL SZ 6.5 (GLOVE) ×2 IMPLANT
GLOVE BIO SURGEONS STRL SZ 6.5 (GLOVE) ×1
GLOVE EXAM NITRILE MD LF STRL (GLOVE) ×3 IMPLANT
LENS ALC ACRYL/TECN (Ophthalmic Related) ×3 IMPLANT
PAD ARMBOARD 7.5X6 YLW CONV (MISCELLANEOUS) ×3 IMPLANT
TAPE TRANSPORE STRL 2 31045 (GAUZE/BANDAGES/DRESSINGS) ×3 IMPLANT
WATER STERILE IRR 250ML POUR (IV SOLUTION) ×3 IMPLANT

## 2014-05-10 NOTE — Anesthesia Postprocedure Evaluation (Signed)
  Anesthesia Post-op Note  Patient: Michelle Roth  Procedure(s) Performed: Procedure(s) with comments: CATARACT EXTRACTION PHACO AND INTRAOCULAR LENS PLACEMENT (IOC) (Right) - CDE:6.25  Patient Location: Short Stay  Anesthesia Type:MAC  Level of Consciousness: awake, alert , oriented and patient cooperative  Airway and Oxygen Therapy: Patient Spontanous Breathing  Post-op Pain: none  Post-op Assessment: Post-op Vital signs reviewed, Patient's Cardiovascular Status Stable, Respiratory Function Stable, Patent Airway and Adequate PO intake  Post-op Vital Signs: Reviewed and stable  Last Vitals:  Filed Vitals:   05/10/14 0834  Temp: 10.1 C    Complications: No apparent anesthesia complications

## 2014-05-10 NOTE — Anesthesia Preprocedure Evaluation (Signed)
Anesthesia Evaluation  Patient identified by MRN, date of birth, ID band Patient awake    Reviewed: Allergy & Precautions, NPO status , Patient's Chart, lab work & pertinent test results  Airway Mallampati: II  TM Distance: >3 FB     Dental  (+) Teeth Intact, Implants   Pulmonary shortness of breath and with exertion, sleep apnea , former smoker,  breath sounds clear to auscultation        Cardiovascular hypertension, Pt. on medications + dysrhythmias Rhythm:Regular Rate:Normal     Neuro/Psych PSYCHIATRIC DISORDERS Anxiety Depression    GI/Hepatic   Endo/Other  Hypothyroidism   Renal/GU      Musculoskeletal   Abdominal   Peds  Hematology   Anesthesia Other Findings   Reproductive/Obstetrics                             Anesthesia Physical Anesthesia Plan  ASA: II  Anesthesia Plan: MAC   Post-op Pain Management:    Induction: Intravenous  Airway Management Planned: Nasal Cannula  Additional Equipment:   Intra-op Plan:   Post-operative Plan:   Informed Consent: I have reviewed the patients History and Physical, chart, labs and discussed the procedure including the risks, benefits and alternatives for the proposed anesthesia with the patient or authorized representative who has indicated his/her understanding and acceptance.     Plan Discussed with:   Anesthesia Plan Comments:         Anesthesia Quick Evaluation

## 2014-05-10 NOTE — Op Note (Signed)
Patient brought to the operating room and prepped and draped in the usual manner.  Lid speculum inserted in right eye.  Stab incision made at the twelve o'clock position.  Provisc instilled in the anterior chamber.   A 2.4 mm. Stab incision was made temporally.  An anterior capsulotomy was done with a bent 25 gauge needle.  The nucleus was hydrodissected.  The Phaco tip was inserted in the anterior chamber and the nucleus was emulsified.  CDE was 6.25.  The cortical material was then removed with the I and A tip.  Posterior capsule was the polished.  The anterior chamber was deepened with Provisc.  A 19.5 Alcon SN60WF IOL was then inserted in the capsular bag.  Provisc was then removed with the I and A tip.  The wound was then hydrated.  Patient sent to the Recovery Room in good condition with follow up in my office.  Preoperative Diagnosis:  Nuclear Cataract OD Postoperative Diagnosis:  Same Procedure name: Kelman Phacoemulsification OD with IOL

## 2014-05-10 NOTE — Discharge Instructions (Signed)
JEZEBEL POLLET  05/10/2014           [facility] Instructions [place] [address]      1. Avoid closing eyes tightly. One often closes the eye tightly when laughing, talking, sneezing, coughing or if they feel irritated. At these times, you should be careful not to close your eyes tightly.  2. Instill eye drops as instructed. To instill drops in your eye, open it, look up and have someone gently pull the lower lid down and instill a couple of drops inside the lower lid.  3. Do not touch upper lid.  4. Take Advil or Tylenol for pain.  5. You may use either eye for near work, such as reading or sewing and you may watch television.  6. You may have your hair done at the beauty parlor at any time.  7. Wear dark glasses with or without your own glasses if you are in bright light.  8. Call our office at [phone] or [phone] if you have sharp pain in your eye or unusual symptoms.  9. Do not be concerned because vision in the operative eye is not good. It will not be good, no matter how successful the operation, until you get a special lens for it. Your old glasses will not be suited to the new eye that was operated on and you will not be ready for a new lens for about a month.  10. Follow up at the [facility] office.  Today between 2 and 3:00    I have received a copy of the above instructions and will follow them.

## 2014-05-10 NOTE — H&P (Signed)
The patient was re examined and there is no change in the patients condition since the original H and P. 

## 2014-05-10 NOTE — Transfer of Care (Signed)
Immediate Anesthesia Transfer of Care Note  Patient: Michelle Roth  Procedure(s) Performed: Procedure(s) with comments: CATARACT EXTRACTION PHACO AND INTRAOCULAR LENS PLACEMENT (IOC) (Right) - CDE:6.25  Patient Location: Short Stay  Anesthesia Type:MAC  Level of Consciousness: awake, alert , oriented and patient cooperative  Airway & Oxygen Therapy: Patient Spontanous Breathing  Post-op Assessment: Report given to RN, Post -op Vital signs reviewed and stable and Patient moving all extremities  Post vital signs: Reviewed and stable  Last Vitals:  Filed Vitals:   05/10/14 0834  Temp: 00.9 C    Complications: No apparent anesthesia complications

## 2014-05-11 ENCOUNTER — Encounter (HOSPITAL_COMMUNITY): Payer: Self-pay | Admitting: Ophthalmology

## 2014-05-31 DIAGNOSIS — H2512 Age-related nuclear cataract, left eye: Secondary | ICD-10-CM | POA: Diagnosis not present

## 2014-06-08 ENCOUNTER — Encounter (HOSPITAL_COMMUNITY): Payer: Self-pay

## 2014-06-08 ENCOUNTER — Encounter (HOSPITAL_COMMUNITY)
Admission: RE | Admit: 2014-06-08 | Discharge: 2014-06-08 | Disposition: A | Discharge status: Home / Self Care | Payer: Medicare Other | Source: Ambulatory Visit | Attending: Ophthalmology | Admitting: Ophthalmology

## 2014-06-14 ENCOUNTER — Ambulatory Visit (HOSPITAL_COMMUNITY): Payer: Medicare Other | Admitting: Anesthesiology

## 2014-06-14 ENCOUNTER — Encounter (HOSPITAL_COMMUNITY): Payer: Self-pay | Admitting: Anesthesiology

## 2014-06-14 ENCOUNTER — Ambulatory Visit (HOSPITAL_COMMUNITY)
Admission: RE | Admit: 2014-06-14 | Discharge: 2014-06-14 | Disposition: A | Discharge status: Home / Self Care | Payer: Medicare Other | Source: Ambulatory Visit | Attending: Ophthalmology | Admitting: Ophthalmology

## 2014-06-14 ENCOUNTER — Encounter (HOSPITAL_COMMUNITY)
Admission: RE | Disposition: A | Discharge status: Home / Self Care | Payer: Self-pay | Source: Ambulatory Visit | Attending: Ophthalmology

## 2014-06-14 DIAGNOSIS — I10 Essential (primary) hypertension: Secondary | ICD-10-CM | POA: Diagnosis not present

## 2014-06-14 DIAGNOSIS — H259 Unspecified age-related cataract: Secondary | ICD-10-CM | POA: Diagnosis not present

## 2014-06-14 DIAGNOSIS — Z79899 Other long term (current) drug therapy: Secondary | ICD-10-CM | POA: Insufficient documentation

## 2014-06-14 DIAGNOSIS — E039 Hypothyroidism, unspecified: Secondary | ICD-10-CM | POA: Insufficient documentation

## 2014-06-14 DIAGNOSIS — H2512 Age-related nuclear cataract, left eye: Secondary | ICD-10-CM | POA: Insufficient documentation

## 2014-06-14 DIAGNOSIS — Z87891 Personal history of nicotine dependence: Secondary | ICD-10-CM | POA: Insufficient documentation

## 2014-06-14 DIAGNOSIS — G4730 Sleep apnea, unspecified: Secondary | ICD-10-CM | POA: Diagnosis not present

## 2014-06-14 HISTORY — PX: CATARACT EXTRACTION W/PHACO: SHX586

## 2014-06-14 SURGERY — PHACOEMULSIFICATION, CATARACT, WITH IOL INSERTION
Anesthesia: Monitor Anesthesia Care | Site: Eye | Laterality: Left

## 2014-06-14 MED ORDER — BSS IO SOLN
INTRAOCULAR | Status: DC | PRN
  Administered 2014-06-14: 15 mL

## 2014-06-14 MED ORDER — MIDAZOLAM HCL 2 MG/2ML IJ SOLN
1.0000 mg | INTRAMUSCULAR | Status: DC | PRN
  Administered 2014-06-14: 2 mg via INTRAVENOUS

## 2014-06-14 MED ORDER — KETOROLAC TROMETHAMINE 0.5 % OP SOLN
1.0000 [drp] | OPHTHALMIC | Status: AC
  Administered 2014-06-14 (×3): 1 [drp] via OPHTHALMIC

## 2014-06-14 MED ORDER — FENTANYL CITRATE (PF) 100 MCG/2ML IJ SOLN
INTRAMUSCULAR | Status: AC
  Filled 2014-06-14: qty 2

## 2014-06-14 MED ORDER — MIDAZOLAM HCL 2 MG/2ML IJ SOLN
INTRAMUSCULAR | Status: AC
  Filled 2014-06-14: qty 2

## 2014-06-14 MED ORDER — CYCLOPENTOLATE-PHENYLEPHRINE 0.2-1 % OP SOLN
1.0000 [drp] | OPHTHALMIC | Status: AC
  Administered 2014-06-14 (×3): 1 [drp] via OPHTHALMIC

## 2014-06-14 MED ORDER — PHENYLEPHRINE HCL 2.5 % OP SOLN
1.0000 [drp] | OPHTHALMIC | Status: AC
  Administered 2014-06-14 (×3): 1 [drp] via OPHTHALMIC

## 2014-06-14 MED ORDER — LACTATED RINGERS IV SOLN
INTRAVENOUS | Status: DC
  Administered 2014-06-14: 1000 mL via INTRAVENOUS

## 2014-06-14 MED ORDER — TETRACAINE 0.5 % OP SOLN OPTIME - NO CHARGE
OPHTHALMIC | Status: DC | PRN
  Administered 2014-06-14: 1 [drp] via OPHTHALMIC

## 2014-06-14 MED ORDER — FENTANYL CITRATE (PF) 100 MCG/2ML IJ SOLN
25.0000 ug | INTRAMUSCULAR | Status: AC
  Administered 2014-06-14 (×2): 25 ug via INTRAVENOUS

## 2014-06-14 MED ORDER — EPINEPHRINE HCL 1 MG/ML IJ SOLN
INTRAMUSCULAR | Status: AC
  Filled 2014-06-14: qty 1

## 2014-06-14 MED ORDER — PROVISC 10 MG/ML IO SOLN
INTRAOCULAR | Status: DC | PRN
  Administered 2014-06-14: 0.85 mL via INTRAOCULAR

## 2014-06-14 MED ORDER — TETRACAINE HCL 0.5 % OP SOLN
1.0000 [drp] | OPHTHALMIC | Status: AC
  Administered 2014-06-14 (×3): 1 [drp] via OPHTHALMIC

## 2014-06-14 MED ORDER — BSS IO SOLN
INTRAOCULAR | Status: DC | PRN
  Administered 2014-06-14: 500 mL

## 2014-06-14 SURGICAL SUPPLY — 10 items

## 2014-06-14 NOTE — Anesthesia Preprocedure Evaluation (Signed)
Anesthesia Evaluation  Patient identified by MRN, date of birth, ID band Patient awake    Reviewed: Allergy & Precautions, NPO status , Patient's Chart, lab work & pertinent test results  Airway Mallampati: II  TM Distance: >3 FB     Dental  (+) Teeth Intact, Implants   Pulmonary shortness of breath and with exertion, sleep apnea , former smoker,  breath sounds clear to auscultation        Cardiovascular hypertension, Pt. on medications + dysrhythmias Rhythm:Regular Rate:Normal     Neuro/Psych PSYCHIATRIC DISORDERS Anxiety Depression    GI/Hepatic   Endo/Other  Hypothyroidism   Renal/GU      Musculoskeletal   Abdominal   Peds  Hematology   Anesthesia Other Findings   Reproductive/Obstetrics                             Anesthesia Physical Anesthesia Plan  ASA: II  Anesthesia Plan: MAC   Post-op Pain Management:    Induction: Intravenous  Airway Management Planned: Nasal Cannula  Additional Equipment:   Intra-op Plan:   Post-operative Plan:   Informed Consent: I have reviewed the patients History and Physical, chart, labs and discussed the procedure including the risks, benefits and alternatives for the proposed anesthesia with the patient or authorized representative who has indicated his/her understanding and acceptance.     Plan Discussed with:   Anesthesia Plan Comments:         Anesthesia Quick Evaluation

## 2014-06-14 NOTE — H&P (Signed)
The patient was re examined and there is no change in the patients condition since the original H and P. 

## 2014-06-14 NOTE — Anesthesia Postprocedure Evaluation (Signed)
  Anesthesia Post-op Note  Patient: Michelle Roth  Procedure(s) Performed: Procedure(s) with comments: CATARACT EXTRACTION PHACO AND INTRAOCULAR LENS PLACEMENT (IOC) (Left) - CDE:4.19  Patient Location: Short Stay  Anesthesia Type:MAC  Level of Consciousness: awake, alert , oriented and patient cooperative  Airway and Oxygen Therapy: Patient Spontanous Breathing  Post-op Pain: none  Post-op Assessment: Post-op Vital signs reviewed, Patient's Cardiovascular Status Stable, Respiratory Function Stable, Patent Airway, No signs of Nausea or vomiting and Pain level controlled  Post-op Vital Signs: Reviewed and stable  Last Vitals:  Filed Vitals:   06/14/14 0835  BP: 154/81  Pulse:   Temp:   Resp: 15    Complications: No apparent anesthesia complications

## 2014-06-14 NOTE — Op Note (Signed)
Patient brought to the operating room and prepped and draped in the usual manner.  Lid speculum inserted in left eye.  Stab incision made at the twelve o'clock position.  Provisc instilled in the anterior chamber.   A 2.4 mm. Stab incision was made temporally.  An anterior capsulotomy was done with a bent 25 gauge needle.  The nucleus was hydrodissected.  The Phaco tip was inserted in the anterior chamber and the nucleus was emulsified.  CDE was 4.19.  The cortical material was then removed with the I and A tip.  Posterior capsule was the polished.  The anterior chamber was deepened with Provisc.  A 20.0 Diopter Alcon SN60WF IOL was then inserted in the capsular bag.  Provisc was then removed with the I and A tip.  The wound was then hydrated.  Patient sent to the Recovery Room in good condition with follow up in my office.  Preoperative Diagnosis:  Nuclear Cataract OS Postoperative Diagnosis:  Same Procedure name: Kelman Phacoemulsification OS with IOL

## 2014-06-14 NOTE — Discharge Instructions (Signed)
Michelle Roth  06/14/2014           [facility] Instructions [place] [address]      1. Avoid closing eyes tightly. One often closes the eye tightly when laughing, talking, sneezing, coughing or if they feel irritated. At these times, you should be careful not to close your eyes tightly.  2. Instill eye drops as instructed. To instill drops in your eye, open it, look up and have someone gently pull the lower lid down and instill a couple of drops inside the lower lid.  3. Do not touch upper lid.  4. Take Advil or Tylenol for pain.  5. You may use either eye for near work, such as reading or sewing and you may watch television.  6. You may have your hair done at the beauty parlor at any time.  7. Wear dark glasses with or without your own glasses if you are in bright light.  8. Call our office at [phone] or [phone] if you have sharp pain in your eye or unusual symptoms.  9. Do not be concerned because vision in the operative eye is not good. It will not be good, no matter how successful the operation, until you get a special lens for it. Your old glasses will not be suited to the new eye that was operated on and you will not be ready for a new lens for about a month.  10. Follow up at the [facility] office.    I have received a copy of the above instructions and will follow them.     Follow up appointment with Dr. Gershon Crane today between 2-3 pm

## 2014-06-14 NOTE — Transfer of Care (Signed)
Immediate Anesthesia Transfer of Care Note  Patient: Michelle Roth  Procedure(s) Performed: Procedure(s) with comments: CATARACT EXTRACTION PHACO AND INTRAOCULAR LENS PLACEMENT (IOC) (Left) - CDE:4.19  Patient Location: Short Stay  Anesthesia Type:MAC  Level of Consciousness: awake, alert , oriented and patient cooperative  Airway & Oxygen Therapy: Patient Spontanous Breathing  Post-op Assessment: Report given to RN and Post -op Vital signs reviewed and stable  Post vital signs: Reviewed and stable  Last Vitals:  Filed Vitals:   06/14/14 0835  BP: 154/81  Pulse:   Temp:   Resp: 15    Complications: No apparent anesthesia complications

## 2014-06-14 NOTE — Anesthesia Procedure Notes (Signed)
Procedure Name: MAC Date/Time: 06/14/2014 8:44 AM Performed by: Andree Elk, Aeriana Speece A Pre-anesthesia Checklist: Patient identified, Timeout performed, Emergency Drugs available, Suction available and Patient being monitored Oxygen Delivery Method: Nasal cannula

## 2014-06-15 ENCOUNTER — Encounter (HOSPITAL_COMMUNITY): Payer: Self-pay | Admitting: Ophthalmology

## 2014-06-17 DIAGNOSIS — E782 Mixed hyperlipidemia: Secondary | ICD-10-CM | POA: Diagnosis not present

## 2014-06-17 DIAGNOSIS — Z6841 Body Mass Index (BMI) 40.0 and over, adult: Secondary | ICD-10-CM | POA: Diagnosis not present

## 2014-06-17 DIAGNOSIS — E039 Hypothyroidism, unspecified: Secondary | ICD-10-CM | POA: Diagnosis not present

## 2014-06-17 DIAGNOSIS — I10 Essential (primary) hypertension: Secondary | ICD-10-CM | POA: Diagnosis not present

## 2014-06-21 DIAGNOSIS — E782 Mixed hyperlipidemia: Secondary | ICD-10-CM | POA: Diagnosis not present

## 2014-07-12 DIAGNOSIS — Z961 Presence of intraocular lens: Secondary | ICD-10-CM | POA: Diagnosis not present

## 2014-12-26 DIAGNOSIS — M5136 Other intervertebral disc degeneration, lumbar region: Secondary | ICD-10-CM | POA: Diagnosis not present

## 2014-12-26 DIAGNOSIS — G894 Chronic pain syndrome: Secondary | ICD-10-CM | POA: Diagnosis not present

## 2015-01-15 DIAGNOSIS — I639 Cerebral infarction, unspecified: Secondary | ICD-10-CM

## 2015-01-15 HISTORY — DX: Cerebral infarction, unspecified: I63.9

## 2015-02-13 DIAGNOSIS — N3001 Acute cystitis with hematuria: Secondary | ICD-10-CM | POA: Diagnosis not present

## 2015-02-13 DIAGNOSIS — R110 Nausea: Secondary | ICD-10-CM | POA: Diagnosis not present

## 2015-02-13 DIAGNOSIS — R300 Dysuria: Secondary | ICD-10-CM | POA: Diagnosis not present

## 2015-02-21 DIAGNOSIS — E039 Hypothyroidism, unspecified: Secondary | ICD-10-CM | POA: Diagnosis not present

## 2015-02-21 DIAGNOSIS — E785 Hyperlipidemia, unspecified: Secondary | ICD-10-CM | POA: Diagnosis not present

## 2015-02-21 DIAGNOSIS — E1165 Type 2 diabetes mellitus with hyperglycemia: Secondary | ICD-10-CM | POA: Diagnosis not present

## 2015-02-24 DIAGNOSIS — E1165 Type 2 diabetes mellitus with hyperglycemia: Secondary | ICD-10-CM | POA: Diagnosis not present

## 2015-02-24 DIAGNOSIS — M545 Low back pain: Secondary | ICD-10-CM | POA: Diagnosis not present

## 2015-02-24 DIAGNOSIS — Z1389 Encounter for screening for other disorder: Secondary | ICD-10-CM | POA: Diagnosis not present

## 2015-02-24 DIAGNOSIS — M47817 Spondylosis without myelopathy or radiculopathy, lumbosacral region: Secondary | ICD-10-CM | POA: Diagnosis not present

## 2015-03-31 DIAGNOSIS — I10 Essential (primary) hypertension: Secondary | ICD-10-CM | POA: Diagnosis not present

## 2015-03-31 DIAGNOSIS — E119 Type 2 diabetes mellitus without complications: Secondary | ICD-10-CM | POA: Diagnosis not present

## 2015-06-06 DIAGNOSIS — J40 Bronchitis, not specified as acute or chronic: Secondary | ICD-10-CM | POA: Diagnosis not present

## 2015-06-06 DIAGNOSIS — R05 Cough: Secondary | ICD-10-CM | POA: Diagnosis not present

## 2015-06-06 DIAGNOSIS — J45901 Unspecified asthma with (acute) exacerbation: Secondary | ICD-10-CM | POA: Diagnosis not present

## 2015-07-07 DIAGNOSIS — Z1389 Encounter for screening for other disorder: Secondary | ICD-10-CM | POA: Diagnosis not present

## 2015-07-07 DIAGNOSIS — I10 Essential (primary) hypertension: Secondary | ICD-10-CM | POA: Diagnosis not present

## 2015-07-07 DIAGNOSIS — E063 Autoimmune thyroiditis: Secondary | ICD-10-CM | POA: Diagnosis not present

## 2015-07-07 DIAGNOSIS — E119 Type 2 diabetes mellitus without complications: Secondary | ICD-10-CM | POA: Diagnosis not present

## 2015-07-10 DIAGNOSIS — Z1231 Encounter for screening mammogram for malignant neoplasm of breast: Secondary | ICD-10-CM | POA: Diagnosis not present

## 2015-08-08 DIAGNOSIS — Z961 Presence of intraocular lens: Secondary | ICD-10-CM | POA: Diagnosis not present

## 2015-08-15 ENCOUNTER — Emergency Department (HOSPITAL_COMMUNITY): Payer: Medicare Other

## 2015-08-15 ENCOUNTER — Emergency Department (EMERGENCY_DEPARTMENT_HOSPITAL)
Admission: EM | Admit: 2015-08-15 | Discharge: 2015-08-15 | Disposition: A | Discharge status: Home / Self Care | Payer: Medicare Other | Source: Home / Self Care | Attending: Emergency Medicine | Admitting: Emergency Medicine

## 2015-08-15 ENCOUNTER — Encounter (HOSPITAL_COMMUNITY): Payer: Self-pay | Admitting: Emergency Medicine

## 2015-08-15 DIAGNOSIS — R202 Paresthesia of skin: Secondary | ICD-10-CM | POA: Insufficient documentation

## 2015-08-15 DIAGNOSIS — Z9049 Acquired absence of other specified parts of digestive tract: Secondary | ICD-10-CM | POA: Insufficient documentation

## 2015-08-15 DIAGNOSIS — Z961 Presence of intraocular lens: Secondary | ICD-10-CM | POA: Diagnosis not present

## 2015-08-15 DIAGNOSIS — R531 Weakness: Secondary | ICD-10-CM | POA: Diagnosis not present

## 2015-08-15 DIAGNOSIS — R7303 Prediabetes: Secondary | ICD-10-CM | POA: Diagnosis not present

## 2015-08-15 DIAGNOSIS — Z881 Allergy status to other antibiotic agents status: Secondary | ICD-10-CM | POA: Diagnosis not present

## 2015-08-15 DIAGNOSIS — F419 Anxiety disorder, unspecified: Secondary | ICD-10-CM | POA: Diagnosis not present

## 2015-08-15 DIAGNOSIS — G8194 Hemiplegia, unspecified affecting left nondominant side: Secondary | ICD-10-CM | POA: Diagnosis not present

## 2015-08-15 DIAGNOSIS — E785 Hyperlipidemia, unspecified: Secondary | ICD-10-CM | POA: Diagnosis not present

## 2015-08-15 DIAGNOSIS — Z7982 Long term (current) use of aspirin: Secondary | ICD-10-CM | POA: Insufficient documentation

## 2015-08-15 DIAGNOSIS — G4733 Obstructive sleep apnea (adult) (pediatric): Secondary | ICD-10-CM | POA: Diagnosis not present

## 2015-08-15 DIAGNOSIS — M50223 Other cervical disc displacement at C6-C7 level: Secondary | ICD-10-CM | POA: Diagnosis not present

## 2015-08-15 DIAGNOSIS — Z888 Allergy status to other drugs, medicaments and biological substances status: Secondary | ICD-10-CM | POA: Insufficient documentation

## 2015-08-15 DIAGNOSIS — G4730 Sleep apnea, unspecified: Secondary | ICD-10-CM | POA: Diagnosis not present

## 2015-08-15 DIAGNOSIS — Z9889 Other specified postprocedural states: Secondary | ICD-10-CM

## 2015-08-15 DIAGNOSIS — N390 Urinary tract infection, site not specified: Secondary | ICD-10-CM | POA: Diagnosis not present

## 2015-08-15 DIAGNOSIS — I639 Cerebral infarction, unspecified: Secondary | ICD-10-CM | POA: Diagnosis not present

## 2015-08-15 DIAGNOSIS — I739 Peripheral vascular disease, unspecified: Secondary | ICD-10-CM | POA: Insufficient documentation

## 2015-08-15 DIAGNOSIS — M50222 Other cervical disc displacement at C5-C6 level: Secondary | ICD-10-CM | POA: Diagnosis not present

## 2015-08-15 DIAGNOSIS — I10 Essential (primary) hypertension: Secondary | ICD-10-CM | POA: Insufficient documentation

## 2015-08-15 DIAGNOSIS — Z9841 Cataract extraction status, right eye: Secondary | ICD-10-CM | POA: Diagnosis not present

## 2015-08-15 DIAGNOSIS — F329 Major depressive disorder, single episode, unspecified: Secondary | ICD-10-CM | POA: Insufficient documentation

## 2015-08-15 DIAGNOSIS — I63331 Cerebral infarction due to thrombosis of right posterior cerebral artery: Secondary | ICD-10-CM | POA: Diagnosis not present

## 2015-08-15 DIAGNOSIS — R200 Anesthesia of skin: Secondary | ICD-10-CM

## 2015-08-15 DIAGNOSIS — I6789 Other cerebrovascular disease: Secondary | ICD-10-CM | POA: Diagnosis not present

## 2015-08-15 DIAGNOSIS — E038 Other specified hypothyroidism: Secondary | ICD-10-CM | POA: Diagnosis not present

## 2015-08-15 DIAGNOSIS — Z87891 Personal history of nicotine dependence: Secondary | ICD-10-CM

## 2015-08-15 DIAGNOSIS — E890 Postprocedural hypothyroidism: Secondary | ICD-10-CM | POA: Diagnosis not present

## 2015-08-15 DIAGNOSIS — Z79899 Other long term (current) drug therapy: Secondary | ICD-10-CM | POA: Insufficient documentation

## 2015-08-15 DIAGNOSIS — Z96651 Presence of right artificial knee joint: Secondary | ICD-10-CM | POA: Diagnosis not present

## 2015-08-15 DIAGNOSIS — Z9842 Cataract extraction status, left eye: Secondary | ICD-10-CM | POA: Diagnosis not present

## 2015-08-15 DIAGNOSIS — R29700 NIHSS score 0: Secondary | ICD-10-CM | POA: Diagnosis not present

## 2015-08-15 DIAGNOSIS — R209 Unspecified disturbances of skin sensation: Secondary | ICD-10-CM | POA: Diagnosis not present

## 2015-08-15 LAB — RAPID URINE DRUG SCREEN, HOSP PERFORMED
AMPHETAMINES: NOT DETECTED
BARBITURATES: NOT DETECTED
BENZODIAZEPINES: POSITIVE — AB
Cocaine: NOT DETECTED
Opiates: NOT DETECTED
TETRAHYDROCANNABINOL: NOT DETECTED

## 2015-08-15 LAB — I-STAT CHEM 8, ED
BUN: 16 mg/dL (ref 6–20)
CHLORIDE: 100 mmol/L — AB (ref 101–111)
CREATININE: 0.9 mg/dL (ref 0.44–1.00)
Calcium, Ion: 1.23 mmol/L (ref 1.12–1.23)
GLUCOSE: 152 mg/dL — AB (ref 65–99)
HCT: 46 % (ref 36.0–46.0)
Hemoglobin: 15.6 g/dL — ABNORMAL HIGH (ref 12.0–15.0)
POTASSIUM: 3.8 mmol/L (ref 3.5–5.1)
Sodium: 141 mmol/L (ref 135–145)
TCO2: 28 mmol/L (ref 0–100)

## 2015-08-15 LAB — COMPREHENSIVE METABOLIC PANEL
ALT: 33 U/L (ref 14–54)
AST: 45 U/L — AB (ref 15–41)
Albumin: 4 g/dL (ref 3.5–5.0)
Alkaline Phosphatase: 56 U/L (ref 38–126)
Anion gap: 4 — ABNORMAL LOW (ref 5–15)
BUN: 16 mg/dL (ref 6–20)
CHLORIDE: 104 mmol/L (ref 101–111)
CO2: 28 mmol/L (ref 22–32)
CREATININE: 0.88 mg/dL (ref 0.44–1.00)
Calcium: 9.8 mg/dL (ref 8.9–10.3)
GFR calc Af Amer: >60 mL/min (ref >60)
Glucose, Bld: 152 mg/dL — ABNORMAL HIGH (ref 65–99)
Potassium: 3.6 mmol/L (ref 3.5–5.1)
Sodium: 136 mmol/L (ref 135–145)
Total Bilirubin: 0.9 mg/dL (ref 0.3–1.2)
Total Protein: 8.5 g/dL — ABNORMAL HIGH (ref 6.5–8.1)

## 2015-08-15 LAB — PROTIME-INR
INR: 0.97
Prothrombin Time: 12.9 seconds (ref 11.4–15.2)

## 2015-08-15 LAB — URINE MICROSCOPIC-ADD ON

## 2015-08-15 LAB — CBC
HEMATOCRIT: 43.5 % (ref 36.0–46.0)
HEMOGLOBIN: 14.2 g/dL (ref 12.0–15.0)
MCH: 29.6 pg (ref 26.0–34.0)
MCHC: 32.6 g/dL (ref 30.0–36.0)
MCV: 90.6 fL (ref 78.0–100.0)
Platelets: 201 10*3/uL (ref 150–400)
RBC: 4.8 MIL/uL (ref 3.87–5.11)
RDW: 15.1 % (ref 11.5–15.5)
WBC: 7.3 10*3/uL (ref 4.0–10.5)

## 2015-08-15 LAB — I-STAT TROPONIN, ED: TROPONIN I, POC: 0 ng/mL (ref 0.00–0.08)

## 2015-08-15 LAB — URINALYSIS, ROUTINE W REFLEX MICROSCOPIC
BILIRUBIN URINE: NEGATIVE
Glucose, UA: NEGATIVE mg/dL
KETONES UR: NEGATIVE mg/dL
Nitrite: NEGATIVE
Protein, ur: NEGATIVE mg/dL
Specific Gravity, Urine: 1.02 (ref 1.005–1.030)
pH: 5.5 (ref 5.0–8.0)

## 2015-08-15 LAB — DIFFERENTIAL
BASOS ABS: 0 10*3/uL (ref 0.0–0.1)
BASOS PCT: 0 %
Eosinophils Absolute: 0.1 10*3/uL (ref 0.0–0.7)
Eosinophils Relative: 2 %
LYMPHS ABS: 1 10*3/uL (ref 0.7–4.0)
Lymphocytes Relative: 14 %
MONOS PCT: 6 %
Monocytes Absolute: 0.5 10*3/uL (ref 0.1–1.0)
NEUTROS ABS: 5.7 10*3/uL (ref 1.7–7.7)
Neutrophils Relative %: 78 %

## 2015-08-15 LAB — APTT: APTT: 27 s (ref 24–36)

## 2015-08-15 LAB — ETHANOL: Alcohol, Ethyl (B): <5 mg/dL (ref <5)

## 2015-08-15 MED ORDER — CEPHALEXIN 500 MG PO CAPS: 500.0000 mg | ORAL_CAPSULE | Freq: Three times a day (TID) | ORAL | 0 refills | Status: DC

## 2015-08-15 MED ORDER — ALPRAZOLAM 0.5 MG PO TABS
ORAL_TABLET | ORAL | Status: AC
  Filled 2015-08-15: qty 2

## 2015-08-15 MED ORDER — CEPHALEXIN 500 MG PO CAPS: 500.0000 mg | ORAL_CAPSULE | Freq: Three times a day (TID) | ORAL | Status: DC

## 2015-08-15 MED ORDER — ALPRAZOLAM 0.5 MG PO TABS
1.0000 mg | ORAL_TABLET | Freq: Once | ORAL | Status: AC
  Administered 2015-08-15: 1 mg via ORAL

## 2015-08-15 NOTE — ED Triage Notes (Signed)
Pt reports numbness in left side since yesterday around lunch, pt still numb at this time, no facial droop noted.  Pt alert and oriented, denies confusion/dizziness/slurred speech/trouble speaking/trouble swallowing.

## 2015-08-15 NOTE — ED Provider Notes (Signed)
[place] DEPT Provider Note   CSN: [id number] Arrival date & time: 08/15/15  1413  First Provider Contact:  First MD Initiated Contact with Patient 08/15/15 1416        History   Chief Complaint Chief Complaint  Patient presents with  . Weakness    HPI Michelle Roth is a 65 y.o. female.  She has noticed a numb sensation in her left arm and left leg, associated with left leg, clumsiness, described as "feels like rubber"; both sensations started yesterday. She denies headache, facial numbness, significant weakness, nausea, vomiting, chest pain or new back pain. No prior similar problem. She's been eating well. She is taking her usual medications. There are no other known modifying factors.  HPI  Past Medical History:  Diagnosis Date  . Anxiety   . Depression   . Dysrhythmia   . Hypertension   . Hypothyroidism   . Shortness of breath dyspnea   . Sleep apnea     Patient Active Problem List   Diagnosis Date Noted  . Weakness/Lt sided 08/15/2015    Past Surgical History:  Procedure Laterality Date  . ABDOMINAL HYSTERECTOMY    . APPENDECTOMY    . BREAST SURGERY Left    Lumpectomy/ Benign  . CATARACT EXTRACTION W/PHACO Right 05/10/2014   Procedure: CATARACT EXTRACTION PHACO AND INTRAOCULAR LENS PLACEMENT (IOC);  Surgeon: Rutherford Guys, MD;  Location: [facility];  Service: Ophthalmology;  Laterality: Right;  CDE:6.25  . CATARACT EXTRACTION W/PHACO Left 06/14/2014   Procedure: CATARACT EXTRACTION PHACO AND INTRAOCULAR LENS PLACEMENT (IOC);  Surgeon: Rutherford Guys, MD;  Location: [facility];  Service: Ophthalmology;  Laterality: Left;  CDE:4.19  . CHOLECYSTECTOMY    . KNEE ARTHROPLASTY Right   . REPLACEMENT TOTAL KNEE Left   . TOTAL THYROIDECTOMY Bilateral   . URETHRAL CYST REMOVAL      OB History    No data available       Home Medications    Prior to Admission medications   Medication Sig Start Date End Date Taking? Authorizing Provider  ALPRAZolam Duanne Moron) 1 MG  tablet Take 1 mg by mouth 4 (four) times daily.   Yes Historical Provider, MD  aspirin EC 81 MG tablet Take 81 mg by mouth daily.   Yes Historical Provider, MD  bisoprolol-hydrochlorothiazide (ZIAC) 2.5-6.25 MG per tablet Take 1 tablet by mouth daily.   Yes Historical Provider, MD  HYDROcodone-acetaminophen (NORCO) 10-325 MG per tablet Take 1 tablet by mouth every 6 (six) hours as needed for moderate pain.   Yes Historical Provider, MD  hydrOXYzine (VISTARIL) 25 MG capsule Take 25 mg by mouth every 6 (six) hours as needed for anxiety.   Yes Historical Provider, MD  levothyroxine (SYNTHROID, LEVOTHROID) 150 MCG tablet Take 1 tablet by mouth daily. 08/09/15  Yes Historical Provider, MD  sertraline (ZOLOFT) 100 MG tablet Take 150 mg by mouth daily.   Yes Historical Provider, MD  cephALEXin (KEFLEX) 500 MG capsule Take 1 capsule (500 mg total) by mouth 3 (three) times daily. 08/15/15   Roxan Hockey, MD    Family History History reviewed. No pertinent family history.  Social History Social History  Substance Use Topics  . Smoking status: Former Smoker    Packs/day: 0.25    Years: 16.00    Quit date: 05/05/1985  . Smokeless tobacco: Never Used  . Alcohol use No     Allergies   Biaxin [clarithromycin] and Macrobid [nitrofurantoin monohyd macro]   Review of Systems Review of Systems  All other systems reviewed and are negative.    Physical Exam Updated Vital Signs BP 149/70   Pulse (!) 59   Temp 98 F (36.7 C)   Resp 18   Ht 5\' 6"  (1.676 m)   Wt (!) 317 lb (143.8 kg)   SpO2 99%   BMI 51.17 kg/m   Physical Exam  Constitutional: She is oriented to person, place, and time. She appears well-developed.  Appears older than stated age. Obese.  HENT:  Head: Normocephalic and atraumatic.  Eyes: Conjunctivae and EOM are normal. Pupils are equal, round, and reactive to light.  Neck: Normal range of motion and phonation normal. Neck supple.  Cardiovascular: Normal rate and regular  rhythm.   Pulmonary/Chest: Effort normal and breath sounds normal. She exhibits no tenderness.  Abdominal: Soft. She exhibits no distension. There is no tenderness. There is no guarding.  Musculoskeletal: Normal range of motion.  Neurological: She is alert and oriented to person, place, and time. No cranial nerve deficit. She exhibits normal muscle tone.  No dysarthria, aphasia or nystagmus. No facial asymmetry. Mild dysmetria, left hand. No clear clumsiness of the left lower leg, on heel-to-shin testing. However, she has a left knee replacement and has decreased mobility of the left knee.  Skin: Skin is warm and dry. Capillary refill takes 2 to 3 seconds.  Psychiatric: She has a normal mood and affect. Her behavior is normal. Judgment and thought content normal.  Nursing note and vitals reviewed.    ED Treatments / Results  Labs (all labs ordered are listed, but only abnormal results are displayed) Labs Reviewed  COMPREHENSIVE METABOLIC PANEL - Abnormal; Notable for the following:       Result Value   Glucose, Bld 152 (*)    Total Protein 8.5 (*)    AST 45 (*)    Anion gap 4 (*)    All other components within normal limits  URINE RAPID DRUG SCREEN, HOSP PERFORMED - Abnormal; Notable for the following:    Benzodiazepines POSITIVE (*)    All other components within normal limits  URINALYSIS, ROUTINE W REFLEX MICROSCOPIC (NOT AT [facility]) - Abnormal; Notable for the following:    Hgb urine dipstick TRACE (*)    Leukocytes, UA MODERATE (*)    All other components within normal limits  URINE MICROSCOPIC-ADD ON - Abnormal; Notable for the following:    Squamous Epithelial / LPF 0-5 (*)    Bacteria, UA FEW (*)    All other components within normal limits  I-STAT CHEM 8, ED - Abnormal; Notable for the following:    Chloride 100 (*)    Glucose, Bld 152 (*)    Hemoglobin 15.6 (*)    All other components within normal limits  ETHANOL  PROTIME-INR  APTT  CBC  DIFFERENTIAL  I-STAT  TROPOININ, ED    EKG  EKG Interpretation  Date/Time:  Tuesday August 15 2015 14:27:00 EDT Ventricular Rate:  78 PR Interval:    QRS Duration: 102 QT Interval:  400 QTC Calculation: 456 R Axis:   -36 Text Interpretation:  Sinus rhythm Consider left atrial enlargement Left axis deviation Low voltage, precordial leads since last tracing no significant change Confirmed by Eulis Foster  MD, Nhyla Nappi [id number]) on 08/15/2015 2:32:35 PM       Radiology Ct Head Wo Contrast  Result Date: 08/15/2015 CLINICAL DATA:  numbness in left arm and leg since yesterday. Denies confusion/dizziness/slurred speech/trouble speaking/trouble swallowing. History of HTN. EXAM: CT HEAD WITHOUT CONTRAST TECHNIQUE: Contiguous axial  images were obtained from the base of the skull through the vertex without intravenous contrast. COMPARISON:  None. FINDINGS: There is minimal periventricular white matter change consistent with small vessel disease. There is no intra or extra-axial fluid collection or mass lesion. The basilar cisterns and ventricles have a normal appearance. There is no CT evidence for acute infarction or hemorrhage. Bone windows show no acute calvarial injury. The paranasal and mastoid air cells are normally aerated. IMPRESSION: 1.  No evidence for acute intracranial abnormality. 2. Mild changes of small vessel disease. Electronically Signed   By: Nolon Nations M.D.   On: 08/15/2015 16:14    Procedures Procedures (including critical care time)  Medications Ordered in ED Medications  ALPRAZolam Duanne Moron) tablet 1 mg (1 mg Oral Given 08/15/15 1702)     Initial Impression / Assessment and Plan / ED Course  I have reviewed the triage vital signs and the nursing notes.  Pertinent labs & imaging results that were available during my care of the patient were reviewed by me and considered in my medical decision making (see chart for details).  Clinical Course   Initial evaluation shortly after arrival.- No indication  for thrombolytics, as onset of symptoms was about 24 hours ago.   14:54- MRI brain had been ordered as definitive test for CVA.  They tried to get her on the table, but were unable to, secondary to girth. Will order CT head.   Final Clinical Impressions(s) / ED Diagnoses   Final diagnoses:  Weakness  Paresthesia   Numbness and discoordination, starting yesterday, ongoing, consistent with CVA. Patient was not a candidate for thrombolysis. Evaluation pending, at the time of transfer of care.  Nursing Notes Reviewed/ Care Coordinated, and agree without changes. Applicable Imaging Reviewed.  Interpretation of Laboratory Data incorporated into ED treatment  Plan- Dr. Laverta Baltimore to assess after head CT returns, and determine disposition. Patient will likely need transfer to a [facility], for evaluation and treatment of CVA.     New Prescriptions Discharge Medication List as of 08/15/2015  6:49 PM       Daleen Bo, MD 08/16/15 [phone]

## 2015-08-15 NOTE — ED Provider Notes (Deleted)
[place] DEPT Provider Note   CSN: [id number] Arrival date & time: 08/15/15  1413  First Provider Contact:  First MD Initiated Contact with Patient 08/15/15 1416        History   Chief Complaint Chief Complaint  Patient presents with  . Weakness    HPI Michelle Roth is a 65 y.o. female.  HPI  Past Medical History:  Diagnosis Date  . Anxiety   . Depression   . Dysrhythmia   . Hypertension   . Hypothyroidism   . Shortness of breath dyspnea   . Sleep apnea     There are no active problems to display for this patient.   Past Surgical History:  Procedure Laterality Date  . ABDOMINAL HYSTERECTOMY    . APPENDECTOMY    . BREAST SURGERY Left    Lumpectomy/ Benign  . CATARACT EXTRACTION W/PHACO Right 05/10/2014   Procedure: CATARACT EXTRACTION PHACO AND INTRAOCULAR LENS PLACEMENT (IOC);  Surgeon: Rutherford Guys, MD;  Location: [facility];  Service: Ophthalmology;  Laterality: Right;  CDE:6.25  . CATARACT EXTRACTION W/PHACO Left 06/14/2014   Procedure: CATARACT EXTRACTION PHACO AND INTRAOCULAR LENS PLACEMENT (IOC);  Surgeon: Rutherford Guys, MD;  Location: [facility];  Service: Ophthalmology;  Laterality: Left;  CDE:4.19  . CHOLECYSTECTOMY    . KNEE ARTHROPLASTY Right   . REPLACEMENT TOTAL KNEE Left   . TOTAL THYROIDECTOMY Bilateral   . URETHRAL CYST REMOVAL      OB History    No data available       Home Medications    Prior to Admission medications   Medication Sig Start Date End Date Taking? Authorizing Provider  ALPRAZolam Duanne Moron) 1 MG tablet Take 1 mg by mouth 4 (four) times daily.    Historical Provider, MD  aspirin EC 81 MG tablet Take 81 mg by mouth daily.    Historical Provider, MD  bisoprolol-hydrochlorothiazide [facility]) 2.5-6.25 MG per tablet Take 1 tablet by mouth daily.    Historical Provider, MD  HYDROcodone-acetaminophen (NORCO) 10-325 MG per tablet Take 1 tablet by mouth every 6 (six) hours as needed for moderate pain.    Historical Provider, MD    hydrOXYzine (VISTARIL) 25 MG capsule Take 25 mg by mouth every 6 (six) hours as needed for anxiety.    Historical Provider, MD  levothyroxine (SYNTHROID, LEVOTHROID) 112 MCG tablet Take 112 mcg by mouth daily before breakfast. Takes on Friday, Saturday, and Sunday    Historical Provider, MD  levothyroxine (SYNTHROID, LEVOTHROID) 125 MCG tablet Take 125 mcg by mouth daily before breakfast. Takes on Monday, Tuesday, Wed, and Thurs.    Historical Provider, MD  sertraline (ZOLOFT) 100 MG tablet Take 150 mg by mouth daily.    Historical Provider, MD    Family History History reviewed. No pertinent family history.  Social History Social History  Substance Use Topics  . Smoking status: Former Smoker    Packs/day: 0.25    Years: 16.00    Quit date: 05/05/1985  . Smokeless tobacco: Never Used  . Alcohol use No     Allergies   Biaxin [clarithromycin] and Macrobid [nitrofurantoin monohyd macro]   Review of Systems Review of Systems   Physical Exam Updated Vital Signs BP 154/88 (BP Location: Left Arm)   Pulse 86   Temp 97.9 F (36.6 C) (Oral)   Resp 17   Ht 5\' 6"  (1.676 m)   Wt (!) 317 lb (143.8 kg)   SpO2 97%   BMI 51.17 kg/m   Physical Exam  ED Treatments / Results  Labs (all labs ordered are listed, but only abnormal results are displayed) Labs Reviewed  ETHANOL  PROTIME-INR  APTT  CBC  DIFFERENTIAL  COMPREHENSIVE METABOLIC PANEL  URINE RAPID DRUG SCREEN, HOSP PERFORMED  URINALYSIS, ROUTINE W REFLEX MICROSCOPIC (NOT AT [facility])  I-STAT CHEM 8, ED  I-STAT TROPOININ, ED    EKG  EKG Interpretation None       Radiology No results found.  Procedures Procedures (including critical care time)  Medications Ordered in ED Medications - No data to display   Initial Impression / Assessment and Plan / ED Course  I have reviewed the triage vital signs and the nursing notes.  Pertinent labs & imaging results that were available during my care of the patient  were reviewed by me and considered in my medical decision making (see chart for details).  Clinical Course   Initial evaluation shortly after arrival.- No indication for thrombolytics, as onset of symptoms was about 24 hours ago.   14:54- MRI brain had been ordered as definitive test for CVA.  They tried to get her on the table, but were unable to, secondary to girth. Will order CT head.   Medications - No data to display  Patient Vitals for the past 24 hrs:  BP Temp Temp src Pulse Resp SpO2 Height Weight  08/15/15 1422 154/88 97.9 F (36.6 C) Oral - - - - -  08/15/15 1420 - - Oral 86 17 97 % 5\' 6"  (1.676 m) (!) 317 lb (143.8 kg)         Final Clinical Impressions(s) / ED Diagnoses   Final diagnoses:  Weakness  Paresthesia   Numbness and discoordination, starting yesterday, ongoing, consistent with CVA. Patient was not a candidate for thrombolysis. Evaluation pending, at the time of transfer of care.  Nursing Notes Reviewed/ Care Coordinated, and agree without changes. Applicable Imaging Reviewed.  Interpretation of Laboratory Data incorporated into ED treatment  Plan- Dr. Laverta Baltimore to assess after head CT returns, and determine disposition. Patient will likely need transfer to a [facility], for evaluation and treatment of CVA.     New Prescriptions New Prescriptions   No medications on file     Daleen Bo, MD 08/16/15 [phone]

## 2015-08-15 NOTE — H&P (Signed)
Patient Demographics:    Michelle Roth, is a 65 y.o. female  MRN: [id number]   DOB - 05-Jul-1950  Admit Date - 08/15/2015  Outpatient Primary MD for the patient is Glo Herring., MD   Assessment & Plan:    Principal Problem:   Weakness/Lt sided    1)Lt sided Weakness/Numbness- More than 30 hours duration, patient states the left side feels "different", no headaches, no visual disturbance, no speech or swallowing difficulty, nausea, no chest pains palpitations dizziness or shortness of breath. CT head negative at this time, neuro exam without significant focal findings at this time. Patient requesting discharge home from the ED due to concerns about claustrophobia and anxiety/panic attacks. Husband and son at bedside, RN Anderson Malta Clayton Lefort) at bedside, patient adamant she wants to go home. She promises to follow-up if symptoms worsen. She also promises to follow-up with PCP for possible outpatient MRI of the brain (she prefers open MRI due to claustrophobia). She will continue aspirin regimen pending reevaluation by PCP in a.m. unable to do MRI of the brain at [facility] today due to her body habitus with significant truncal adiposity making it difficult for her to fit into the MRI machine  2)HTN- okay to continue bisoprolol/HCTZ, follow PCP in a.m.  3) anxiety and panic attack-continue Zoloft 100 mg daily, continue Xanax when necessary, continue hydroxyzine  4) hypothyroidism/obesity- patient is status post previous thyroidectomy, continue levothyroxine lifestyle and dietary modifications discussed  5)Social/Ethics- plan of care discussed with patient, husband, and son at bedside, patient is deadly afraid of closed MRI and being hospitalized overnight, she adamantly requests to be discharged home today  with outpatient follow-up and possible outpatient MRI if left-sided weakness and numbness persist. RN present at bedside questions answered to patient and family verbalized understanding of treatment plan and need to follow-up.   6)Chronic LBP-/Possible UTI- UA suggests UTI, patient is largely without fevers or CVA area tenderness, treat empirically with Keflex,   With History of - Reviewed by me  Past Medical History:  Diagnosis Date  . Anxiety   . Depression   . Dysrhythmia   . Hypertension   . Hypothyroidism   . Shortness of breath dyspnea   . Sleep apnea       Past Surgical History:  Procedure Laterality Date  . ABDOMINAL HYSTERECTOMY    . APPENDECTOMY    . BREAST SURGERY Left    Lumpectomy/ Benign  . CATARACT EXTRACTION W/PHACO Right 05/10/2014  Procedure: CATARACT EXTRACTION PHACO AND INTRAOCULAR LENS PLACEMENT (IOC);  Surgeon: Rutherford Guys, MD;  Location: [facility];  Service: Ophthalmology;  Laterality: Right;  CDE:6.25  . CATARACT EXTRACTION W/PHACO Left 06/14/2014   Procedure: CATARACT EXTRACTION PHACO AND INTRAOCULAR LENS PLACEMENT (IOC);  Surgeon: Rutherford Guys, MD;  Location: [facility];  Service: Ophthalmology;  Laterality: Left;  CDE:4.19  . CHOLECYSTECTOMY    . KNEE ARTHROPLASTY Right   . REPLACEMENT TOTAL KNEE Left   . TOTAL THYROIDECTOMY Bilateral   . URETHRAL CYST REMOVAL        Chief Complaint  Patient presents with  . Weakness      HPI:    Michelle Roth  is a 65 y.o. female, With past medical history relevant for hypertension hypothyroidism and obesity as well as anxiety and panic attacks who presents with left-sided numbness and weakness of more than 30 hours duration. No chest pains no palpitations no dizziness no leg pains no leg swelling no pleuritic symptoms no fevers no chills no productive cough no neck stiffness no neck pain. No confusion or  near syncopal episodes. No Speech or swallowing difficulty. Patient states the left side feels "different", no  headaches, no visual disturbance, no speech or swallowing difficulty, nausea, no chest pains palpitations dizziness or shortness of breath. CT head negative at this time, neuro exam without significant focal findings at this time. Patient requesting discharge home from the ED due to concerns about claustrophobia and anxiety/panic attacks. Husband and son at bedside, RN Anderson Malta Clayton Lefort) at bedside, patient adamant she wants to go home. She promises to follow-up if symptoms worsen. She also promises to follow-up with PCP for possible outpatient MRI of the brain (she prefers open MRI due to claustrophobia). She will continue aspirin regimen pending reevaluation by PCP in a.m. unable to do MRI of the brain at [facility] today due to her body habitus with significant truncal adiposity making it difficult for her to fit into the MRI machine. Patient has chronic low back pain with radicular symptoms down the left side from the buttock all the way to the toes typically.     Review of systems:    In addition to the HPI above,   A full 12 point Review of Systems was done, except as stated above, all other Review of Systems were negative.    Social History:  Reviewed by me    Social History  Substance Use Topics  . Smoking status: Former Smoker    Packs/day: 0.25    Years: 16.00    Quit date: 05/05/1985  . Smokeless tobacco: Never Used  . Alcohol use No       Family History :  Reviewed by me   History reviewed. No pertinent family history.   Home Medications:   Prior to Admission medications   Medication Sig Start Date End Date Taking? Authorizing Provider  ALPRAZolam Duanne Moron) 1 MG tablet Take 1 mg by mouth 4 (four) times daily.   Yes Historical Provider, MD  aspirin EC 81 MG tablet Take 81 mg by mouth daily.   Yes Historical Provider, MD  bisoprolol-hydrochlorothiazide (ZIAC) 2.5-6.25 MG per tablet Take 1 tablet by mouth daily.   Yes Historical Provider, MD    HYDROcodone-acetaminophen (NORCO) 10-325 MG per tablet Take 1 tablet by mouth every 6 (six) hours as needed for moderate pain.   Yes Historical Provider, MD  hydrOXYzine (VISTARIL) 25 MG capsule Take 25 mg by mouth every 6 (six) hours as needed for  anxiety.   Yes Historical Provider, MD  levothyroxine (SYNTHROID, LEVOTHROID) 150 MCG tablet Take 1 tablet by mouth daily. 08/09/15  Yes Historical Provider, MD  sertraline (ZOLOFT) 100 MG tablet Take 150 mg by mouth daily.   Yes Historical Provider, MD     Allergies:     Allergies  Allergen Reactions  . Biaxin [Clarithromycin]     Thrush   . Macrobid [Nitrofurantoin Monohyd Macro] Nausea Only     Physical Exam:   Vitals  Blood pressure 149/70, pulse (!) 59, temperature 98 F (36.7 C), resp. rate 18, height 5\' 6"  (1.676 m), weight (!) 143.8 kg (317 lb), SpO2 99 %.  Physical Examination: General appearance - alert, obese appearing, and in no distress a Mental status - alert, oriented to person, place, and time, anxious Eyes - sclera anicteric Neck - supple, no JVD elevation , Chest - clear  to auscultation bilaterally, symmetrical air movement,  Heart - S1 and S2 normal,  Abdomen - soft, nontender, nondistended, no masses or organomegaly, increased truncal adiposity,No CVA area tenderness Neurological - screening mental status exam normal, anxious, neck supple without rigidity, cranial nerves II through XII intact, DTR's normal and symmetric, gait is steady, no focal neuro deficits. Coordination test are normal. No facial asymmetry and no pronator drift, tongue and uvula are midline. Romberg test is negative. Tandem walking is difficult due to low back pain Extremities - no pedal edema noted, intact peripheral pulses  Skin - warm, dry    Data Review:    CBC  Recent Labs Lab 08/15/15 1436 08/15/15 1450  WBC 7.3  --   HGB 14.2 15.6*  HCT 43.5 46.0  PLT 201  --   MCV 90.6  --   MCH 29.6  --   MCHC 32.6  --   RDW 15.1  --    LYMPHSABS 1.0  --   MONOABS 0.5  --   EOSABS 0.1  --   BASOSABS 0.0  --    ------------------------------------------------------------------------------------------------------------------  Chemistries   Recent Labs Lab 08/15/15 1436 08/15/15 1450  NA 136 141  K 3.6 3.8  CL 104 100*  CO2 28  --   GLUCOSE 152* 152*  BUN 16 16  CREATININE 0.88 0.90  CALCIUM 9.8  --   AST 45*  --   ALT 33  --   ALKPHOS 56  --   BILITOT 0.9  --    ------------------------------------------------------------------------------------------------------------------ estimated creatinine clearance is 89.1 mL/min (by C-G formula based on SCr of 0.9 mg/dL). ------------------------------------------------------------------------------------------------------------------ No results for input(s): TSH, T4TOTAL, T3FREE, THYROIDAB in the last 72 hours.  Invalid input(s): FREET3   Coagulation profile  Recent Labs Lab 08/15/15 1436  INR 0.97   ------------------------------------------------------------------------------------------------------------------- No results for input(s): DDIMER in the last 72 hours. -------------------------------------------------------------------------------------------------------------------  Cardiac Enzymes No results for input(s): CKMB, TROPONINI, MYOGLOBIN in the last 168 hours.  Invalid input(s): CK ------------------------------------------------------------------------------------------------------------------ No results found for: BNP   ---------------------------------------------------------------------------------------------------------------  Urinalysis    Component Value Date/Time   COLORURINE YELLOW 08/15/2015 [place] 08/15/2015 1528   LABSPEC 1.020 08/15/2015 1528   PHURINE 5.5 08/15/2015 1528   GLUCOSEU NEGATIVE 08/15/2015 1528   HGBUR TRACE (A) 08/15/2015 1528   BILIRUBINUR NEGATIVE 08/15/2015 1528   KETONESUR NEGATIVE  08/15/2015 1528   PROTEINUR NEGATIVE 08/15/2015 1528   NITRITE NEGATIVE 08/15/2015 1528   LEUKOCYTESUR MODERATE (A) 08/15/2015 1528    ----------------------------------------------------------------------------------------------------------------   Imaging Results:    Ct Head Wo Contrast  Result Date:  08/15/2015 CLINICAL DATA:  numbness in left arm and leg since yesterday. Denies confusion/dizziness/slurred speech/trouble speaking/trouble swallowing. History of HTN. EXAM: CT HEAD WITHOUT CONTRAST TECHNIQUE: Contiguous axial images were obtained from the base of the skull through the vertex without intravenous contrast. COMPARISON:  None. FINDINGS: There is minimal periventricular white matter change consistent with small vessel disease. There is no intra or extra-axial fluid collection or mass lesion. The basilar cisterns and ventricles have a normal appearance. There is no CT evidence for acute infarction or hemorrhage. Bone windows show no acute calvarial injury. The paranasal and mastoid air cells are normally aerated. IMPRESSION: 1.  No evidence for acute intracranial abnormality. 2. Mild changes of small vessel disease. Electronically Signed   By: Nolon Nations M.D.   On: 08/15/2015 16:14    Radiological Exams on Admission: Ct Head Wo Contrast  Result Date: 08/15/2015 CLINICAL DATA:  numbness in left arm and leg since yesterday. Denies confusion/dizziness/slurred speech/trouble speaking/trouble swallowing. History of HTN. EXAM: CT HEAD WITHOUT CONTRAST TECHNIQUE: Contiguous axial images were obtained from the base of the skull through the vertex without intravenous contrast. COMPARISON:  None. FINDINGS: There is minimal periventricular white matter change consistent with small vessel disease. There is no intra or extra-axial fluid collection or mass lesion. The basilar cisterns and ventricles have a normal appearance. There is no CT evidence for acute infarction or hemorrhage. Bone windows  show no acute calvarial injury. The paranasal and mastoid air cells are normally aerated. IMPRESSION: 1.  No evidence for acute intracranial abnormality. 2. Mild changes of small vessel disease. Electronically Signed   By: Nolon Nations M.D.   On: 08/15/2015 16:14    DVT Prophylaxis n/a  AM Labs Ordered, also please review Full Orders  Family Communication: Admission, patients condition and plan of care including tests being ordered have been discussed with the patient and husband and son at bedside who indicate understanding and agree with the plan   Code Status - Full Code  Likely DC to  home today  Condition   medically and neurologically stable  Marcheta Horsey M.D on 08/15/2015 at 7:51 PM   Between 7am to 7pm - Pager - [phone]  After 7pm go to [email or web address] - password [email or web address]  [facility] - Office  [phone]  Dragon dictation system was used to create this note, attempts have been made to correct errors, however presence of uncorrected errors is not a reflection quality of care provided.

## 2015-08-15 NOTE — Discharge Instructions (Signed)
Call or return to ER as soon as possible  if Lt sided weakness worsens  Call if any other concerns, otherwise continue your medications including aspirin and follow up with your primary doctor tomorrow

## 2015-08-15 NOTE — ED Provider Notes (Signed)
Blood pressure 154/88, pulse 86, temperature 97.9 F (36.6 C), temperature source Oral, resp. rate 14, height 5\' 6"  (1.676 m), weight (!) 317 lb (143.8 kg), SpO2 97 %.  Assuming care from Dr. Eulis Foster.  In short, Michelle Roth is a 65 y.o. female with a chief complaint of Weakness .  Refer to the original H&P for additional details.  The current plan of care is to follow CT head and labs. Consider admission for likely CVA yesterday with inability to fit in the [facility] MRI machine.  04:25 PM Updated patient and family on CT head results and my concern for stroke. Patient endorses continued LUE and LLE weakness. No face symptoms. Paged hospitalist for admission and likely transfer to [facility] for MRI and stroke w/u.   Hospitalist has seen the patient. Transferring care at this time.   Nanda Quinton, MD   Margette Fast, MD 08/15/15 [phone]

## 2015-08-15 NOTE — ED Notes (Signed)
Hospitalist in talking with pt and pt wants to go home and follow up tomorrow for MRI .  Pt verbalizes understanding of returning if she has new or worsening symptoms.

## 2015-08-16 ENCOUNTER — Inpatient Hospital Stay (HOSPITAL_COMMUNITY)
Admission: EM | Admit: 2015-08-16 | Discharge: 2015-08-18 | DRG: 065 | Disposition: A | Discharge status: Home Health | Payer: Medicare Other | Attending: Internal Medicine | Admitting: Internal Medicine

## 2015-08-16 ENCOUNTER — Emergency Department (HOSPITAL_COMMUNITY): Payer: Medicare Other

## 2015-08-16 ENCOUNTER — Observation Stay (HOSPITAL_COMMUNITY): Payer: Medicare Other

## 2015-08-16 ENCOUNTER — Encounter (HOSPITAL_COMMUNITY): Payer: Self-pay | Admitting: Neurology

## 2015-08-16 DIAGNOSIS — Z90710 Acquired absence of both cervix and uterus: Secondary | ICD-10-CM

## 2015-08-16 DIAGNOSIS — IMO0001 Reserved for inherently not codable concepts without codable children: Secondary | ICD-10-CM | POA: Insufficient documentation

## 2015-08-16 DIAGNOSIS — F32A Depression, unspecified: Secondary | ICD-10-CM

## 2015-08-16 DIAGNOSIS — E890 Postprocedural hypothyroidism: Secondary | ICD-10-CM | POA: Diagnosis present

## 2015-08-16 DIAGNOSIS — N390 Urinary tract infection, site not specified: Secondary | ICD-10-CM

## 2015-08-16 DIAGNOSIS — F419 Anxiety disorder, unspecified: Secondary | ICD-10-CM

## 2015-08-16 DIAGNOSIS — Z6841 Body Mass Index (BMI) 40.0 and over, adult: Secondary | ICD-10-CM

## 2015-08-16 DIAGNOSIS — Z96651 Presence of right artificial knee joint: Secondary | ICD-10-CM | POA: Diagnosis present

## 2015-08-16 DIAGNOSIS — Z961 Presence of intraocular lens: Secondary | ICD-10-CM | POA: Diagnosis present

## 2015-08-16 DIAGNOSIS — R7303 Prediabetes: Secondary | ICD-10-CM | POA: Insufficient documentation

## 2015-08-16 DIAGNOSIS — I10 Essential (primary) hypertension: Secondary | ICD-10-CM | POA: Diagnosis not present

## 2015-08-16 DIAGNOSIS — G4733 Obstructive sleep apnea (adult) (pediatric): Secondary | ICD-10-CM | POA: Diagnosis present

## 2015-08-16 DIAGNOSIS — E785 Hyperlipidemia, unspecified: Secondary | ICD-10-CM | POA: Diagnosis present

## 2015-08-16 DIAGNOSIS — I63331 Cerebral infarction due to thrombosis of right posterior cerebral artery: Principal | ICD-10-CM | POA: Diagnosis present

## 2015-08-16 DIAGNOSIS — Z9842 Cataract extraction status, left eye: Secondary | ICD-10-CM

## 2015-08-16 DIAGNOSIS — R29700 NIHSS score 0: Secondary | ICD-10-CM | POA: Diagnosis present

## 2015-08-16 DIAGNOSIS — I639 Cerebral infarction, unspecified: Secondary | ICD-10-CM

## 2015-08-16 DIAGNOSIS — G8194 Hemiplegia, unspecified affecting left nondominant side: Secondary | ICD-10-CM | POA: Diagnosis present

## 2015-08-16 DIAGNOSIS — F329 Major depressive disorder, single episode, unspecified: Secondary | ICD-10-CM | POA: Diagnosis present

## 2015-08-16 DIAGNOSIS — G4730 Sleep apnea, unspecified: Secondary | ICD-10-CM | POA: Diagnosis present

## 2015-08-16 DIAGNOSIS — Z7982 Long term (current) use of aspirin: Secondary | ICD-10-CM

## 2015-08-16 DIAGNOSIS — Z9841 Cataract extraction status, right eye: Secondary | ICD-10-CM

## 2015-08-16 DIAGNOSIS — Z79899 Other long term (current) drug therapy: Secondary | ICD-10-CM

## 2015-08-16 DIAGNOSIS — I6381 Other cerebral infarction due to occlusion or stenosis of small artery: Secondary | ICD-10-CM

## 2015-08-16 DIAGNOSIS — E039 Hypothyroidism, unspecified: Secondary | ICD-10-CM | POA: Diagnosis present

## 2015-08-16 DIAGNOSIS — Z881 Allergy status to other antibiotic agents status: Secondary | ICD-10-CM

## 2015-08-16 DIAGNOSIS — Z87891 Personal history of nicotine dependence: Secondary | ICD-10-CM

## 2015-08-16 HISTORY — DX: Obesity, unspecified: E66.9

## 2015-08-16 HISTORY — DX: Prediabetes: R73.03

## 2015-08-16 HISTORY — DX: Cerebral infarction, unspecified: I63.9

## 2015-08-16 HISTORY — DX: Urinary tract infection, site not specified: N39.0

## 2015-08-16 LAB — GLUCOSE, CAPILLARY: Glucose-Capillary: 119 mg/dL — ABNORMAL HIGH (ref 65–99)

## 2015-08-16 LAB — VITAMIN B12: VITAMIN B 12: 156 pg/mL — AB (ref 180–914)

## 2015-08-16 LAB — CBG MONITORING, ED: Glucose-Capillary: 136 mg/dL — ABNORMAL HIGH (ref 65–99)

## 2015-08-16 MED ORDER — ONDANSETRON HCL 4 MG/2ML IJ SOLN: 4.0000 mg | Freq: Four times a day (QID) | INTRAMUSCULAR | Status: DC | PRN

## 2015-08-16 MED ORDER — SENNOSIDES-DOCUSATE SODIUM 8.6-50 MG PO TABS
1.0000 | ORAL_TABLET | Freq: Every evening | ORAL | Status: DC | PRN
  Filled 2015-08-16: qty 1

## 2015-08-16 MED ORDER — HYDRALAZINE HCL 20 MG/ML IJ SOLN
5.0000 mg | Freq: Three times a day (TID) | INTRAMUSCULAR | Status: DC | PRN
  Filled 2015-08-16: qty 1

## 2015-08-16 MED ORDER — ATORVASTATIN CALCIUM 80 MG PO TABS
80.0000 mg | ORAL_TABLET | Freq: Every day | ORAL | Status: DC
  Administered 2015-08-16 – 2015-08-17 (×2): 80 mg via ORAL
  Filled 2015-08-16 (×2): qty 1

## 2015-08-16 MED ORDER — ONDANSETRON HCL 4 MG PO TABS: 4.0000 mg | ORAL_TABLET | Freq: Four times a day (QID) | ORAL | Status: DC | PRN

## 2015-08-16 MED ORDER — MAGNESIUM CITRATE PO SOLN: 1.0000 | Freq: Once | ORAL | Status: DC | PRN

## 2015-08-16 MED ORDER — TRAZODONE HCL 50 MG PO TABS: 25.0000 mg | ORAL_TABLET | Freq: Every evening | ORAL | Status: DC | PRN

## 2015-08-16 MED ORDER — HYDROXYZINE PAMOATE 25 MG PO CAPS
25.0000 mg | ORAL_CAPSULE | Freq: Four times a day (QID) | ORAL | Status: DC | PRN
  Filled 2015-08-16: qty 1

## 2015-08-16 MED ORDER — ACETAMINOPHEN 325 MG PO TABS: 650.0000 mg | ORAL_TABLET | Freq: Four times a day (QID) | ORAL | Status: DC | PRN

## 2015-08-16 MED ORDER — SERTRALINE HCL 50 MG PO TABS
50.0000 mg | ORAL_TABLET | Freq: Every day | ORAL | Status: DC
  Administered 2015-08-17 – 2015-08-18 (×2): 50 mg via ORAL
  Filled 2015-08-16 (×2): qty 1

## 2015-08-16 MED ORDER — STROKE: EARLY STAGES OF RECOVERY BOOK
Freq: Once | Status: AC
  Administered 2015-08-16: 16:00:00
  Filled 2015-08-16: qty 1

## 2015-08-16 MED ORDER — INSULIN ASPART 100 UNIT/ML ~~LOC~~ SOLN
0.0000 [IU] | Freq: Three times a day (TID) | SUBCUTANEOUS | Status: DC
  Administered 2015-08-17 – 2015-08-18 (×2): 1 [IU] via SUBCUTANEOUS

## 2015-08-16 MED ORDER — BISOPROLOL-HYDROCHLOROTHIAZIDE 2.5-6.25 MG PO TABS
1.0000 | ORAL_TABLET | Freq: Every day | ORAL | Status: DC
  Filled 2015-08-16: qty 1

## 2015-08-16 MED ORDER — CEPHALEXIN 500 MG PO CAPS
500.0000 mg | ORAL_CAPSULE | Freq: Three times a day (TID) | ORAL | Status: DC
  Administered 2015-08-16 – 2015-08-18 (×6): 500 mg via ORAL
  Filled 2015-08-16 (×6): qty 1

## 2015-08-16 MED ORDER — HYDROCODONE-ACETAMINOPHEN 10-325 MG PO TABS: 1.0000 | ORAL_TABLET | Freq: Four times a day (QID) | ORAL | Status: DC | PRN

## 2015-08-16 MED ORDER — ALPRAZOLAM 0.5 MG PO TABS
1.0000 mg | ORAL_TABLET | Freq: Four times a day (QID) | ORAL | Status: DC
  Administered 2015-08-16 – 2015-08-18 (×7): 1 mg via ORAL
  Filled 2015-08-16 (×7): qty 2

## 2015-08-16 MED ORDER — SERTRALINE HCL 100 MG PO TABS
100.0000 mg | ORAL_TABLET | Freq: Every day | ORAL | Status: DC
  Administered 2015-08-16 – 2015-08-17 (×2): 100 mg via ORAL
  Filled 2015-08-16 (×2): qty 1

## 2015-08-16 MED ORDER — LEVOTHYROXINE SODIUM 75 MCG PO TABS
150.0000 ug | ORAL_TABLET | Freq: Every day | ORAL | Status: DC
  Administered 2015-08-17 – 2015-08-18 (×2): 150 ug via ORAL
  Filled 2015-08-16: qty 1
  Filled 2015-08-16 (×2): qty 2

## 2015-08-16 MED ORDER — CLOPIDOGREL BISULFATE 75 MG PO TABS
75.0000 mg | ORAL_TABLET | Freq: Every day | ORAL | Status: DC
  Administered 2015-08-16 – 2015-08-18 (×3): 75 mg via ORAL
  Filled 2015-08-16 (×4): qty 1

## 2015-08-16 MED ORDER — SODIUM CHLORIDE 0.9% FLUSH
3.0000 mL | Freq: Two times a day (BID) | INTRAVENOUS | Status: DC
  Administered 2015-08-17 – 2015-08-18 (×2): 3 mL via INTRAVENOUS

## 2015-08-16 MED ORDER — ASPIRIN EC 81 MG PO TBEC
81.0000 mg | DELAYED_RELEASE_TABLET | Freq: Every day | ORAL | Status: DC
  Administered 2015-08-16 – 2015-08-18 (×3): 81 mg via ORAL
  Filled 2015-08-16 (×3): qty 1

## 2015-08-16 MED ORDER — ENOXAPARIN SODIUM 40 MG/0.4ML ~~LOC~~ SOLN
40.0000 mg | SUBCUTANEOUS | Status: DC
  Administered 2015-08-17: 40 mg via SUBCUTANEOUS
  Filled 2015-08-16 (×3): qty 0.4

## 2015-08-16 MED ORDER — SERTRALINE HCL 50 MG PO TABS: 50.0000 mg | ORAL_TABLET | Freq: Every day | ORAL | Status: DC

## 2015-08-16 MED ORDER — SODIUM CHLORIDE 0.9 % IV SOLN
INTRAVENOUS | Status: DC
  Administered 2015-08-16: 17:00:00 via INTRAVENOUS

## 2015-08-16 MED ORDER — ACETAMINOPHEN 650 MG RE SUPP: 650.0000 mg | Freq: Four times a day (QID) | RECTAL | Status: DC | PRN

## 2015-08-16 MED ORDER — BISACODYL 10 MG RE SUPP: 10.0000 mg | Freq: Every day | RECTAL | Status: DC | PRN

## 2015-08-16 NOTE — H&P (Signed)
History and Physical    Michelle Roth [id number] DOB: 08/15/1950 DOA: 08/16/2015   PCP: Glo Herring., MD   Patient coming from:  Home    Chief Complaint: Left sided weakness   HPI: Michelle Roth is a 65 y.o. female with medical history significant for anxiety/panic disorder, HTN, hypothyroidism, obesity, recent UTI treated with Keflex, initially presenting to [facility] with  >24 hr Left sided numbness causing severe anxiety,  requiring further evaluation. She denied any L sided weakness. Patient  never had a similar episode. Denies any history of TIA. Denies vertigo dizziness or vision changes. Denies headaches. No dysarthria. No dysphagia. No confusion. No seizures. Denies any chest pain, or worsening shortness of breath. Denies any fever or chills, or night sweats.Does not smoke. No new meds or hormonal supplements. Does take a regular ASA a day, with no other antiplatelets or anticoagulants. Patient is compliant with his medications. Denies any recent long distance trips. No recent surgeries. No sick contacts. Multiple stressors stressors present in personal life. Patient not very active.Marland Kitchen He is not a diabetic. No family history of stroke. At [facility], workup included CT head which was negative, MRI was to be performed but she was concerned about claustrophobia, and adammatly wanted to be discharged. She was instructed to present to [facility] for further evaluation. She was discharged from ED on ASA.  Patient was not administered TPA as is beyond time window for treatment consideration. Will admit for further evaluation and treatment.   ED Course:  BP 138/87   Pulse 62   Resp 14   SpO2 97%     MRI performed on May 08/16/15 showed a small area of acute on the second ischemic infarction in the right thalamus.  MRI of the C-spine showed diffuse degenerative changes with multilevel neural foraminastenosis without abnormal osseous lesions.  Carotid dopplers, TTE, Fasting lipids,  Hemoglobin A1C pending ASA was changed to PLavix  For stroke prevention  Permissive Hypertension was allowed.  (to treat for BP 220/110 Mod Leukocytes in Urine  EKG without ACS . Tn 0    Review of Systems: As per HPI otherwise 10 point review of systems negative.   Past Medical History:  Diagnosis Date  . Anxiety   . Depression   . Dysrhythmia   . Hypertension   . Hypothyroidism   . Obesity   . Prediabetes   . Shortness of breath dyspnea   . Sleep apnea   . Stroke ([place])   . UTI (lower urinary tract infection)     Past Surgical History:  Procedure Laterality Date  . ABDOMINAL HYSTERECTOMY    . APPENDECTOMY    . BREAST SURGERY Left    Lumpectomy/ Benign  . CATARACT EXTRACTION W/PHACO Right 05/10/2014   Procedure: CATARACT EXTRACTION PHACO AND INTRAOCULAR LENS PLACEMENT (IOC);  Surgeon: Rutherford Guys, MD;  Location: [facility];  Service: Ophthalmology;  Laterality: Right;  CDE:6.25  . CATARACT EXTRACTION W/PHACO Left 06/14/2014   Procedure: CATARACT EXTRACTION PHACO AND INTRAOCULAR LENS PLACEMENT (IOC);  Surgeon: Rutherford Guys, MD;  Location: [facility];  Service: Ophthalmology;  Laterality: Left;  CDE:4.19  . CHOLECYSTECTOMY    . KNEE ARTHROPLASTY Right   . REPLACEMENT TOTAL KNEE Left   . TOTAL THYROIDECTOMY Bilateral   . URETHRAL CYST REMOVAL      Social History Social History   Social History  . Marital status: Married    Spouse name: N/A  . Number of children: N/A  . Years of education:  N/A   Occupational History  . Not on file.   Social History Main Topics  . Smoking status: Former Smoker    Packs/day: 0.25    Years: 16.00    Quit date: 05/05/1985  . Smokeless tobacco: Never Used  . Alcohol use No  . Drug use: No  . Sexual activity: Not on file   Other Topics Concern  . Not on file   Social History Narrative  . No narrative on file     Allergies  Allergen Reactions  . Biaxin [Clarithromycin]     Thrush   . Macrobid [Nitrofurantoin Monohyd Macro] Nausea  Only    No family history on file.    Prior to Admission medications   Medication Sig Start Date End Date Taking? Authorizing Provider  ALPRAZolam Duanne Moron) 1 MG tablet Take 1 mg by mouth 4 (four) times daily.   Yes Historical Provider, MD  aspirin EC 81 MG tablet Take 81 mg by mouth daily.   Yes Historical Provider, MD  bisoprolol-hydrochlorothiazide (ZIAC) 2.5-6.25 MG per tablet Take 1 tablet by mouth daily.   Yes Historical Provider, MD  HYDROcodone-acetaminophen (NORCO) 10-325 MG per tablet Take 1 tablet by mouth every 6 (six) hours as needed for moderate pain.   Yes Historical Provider, MD  levothyroxine (SYNTHROID, LEVOTHROID) 150 MCG tablet Take 150 mcg by mouth daily.  08/09/15  Yes Historical Provider, MD  sertraline (ZOLOFT) 100 MG tablet Take 50-100 mg by mouth daily. Take 50mg  every morning  Take 100mg  every night   Yes Historical Provider, MD  cephALEXin (KEFLEX) 500 MG capsule Take 1 capsule (500 mg total) by mouth 3 (three) times daily. 08/15/15   Roxan Hockey, MD  hydrOXYzine (VISTARIL) 25 MG capsule Take 25 mg by mouth every 6 (six) hours as needed for anxiety.    Historical Provider, MD    Physical Exam:    Vitals:   08/16/15 1145 08/16/15 1200 08/16/15 1430 08/16/15 1500  BP: 125/62 139/63 148/66 138/87  Pulse: (!) 53 (!) 59 64 62  Resp: 12 18 15 14   TempSrc:      SpO2: 100% 97% 96% 97%       Constitutional: NAD, very anxious and talkative Vitals:   08/16/15 1145 08/16/15 1200 08/16/15 1430 08/16/15 1500  BP: 125/62 139/63 148/66 138/87  Pulse: (!) 53 (!) 59 64 62  Resp: 12 18 15 14   TempSrc:      SpO2: 100% 97% 96% 97%   Eyes: PERRL, lids and conjunctivae normal ENMT: Mucous membranes are moist. Posterior pharynx clear of any exudate or lesions.Normal dentition.  Neck: normal, supple, no masses, no thyromegaly Respiratory: clear to auscultation bilaterally, no wheezing, no crackles. Normal respiratory effort. No accessory muscle use.  Cardiovascular:  Regular rate and rhythm, no murmurs / rubs / gallops. No extremity edema. 2+ pedal pulses. No carotid bruits.  Abdomen:  Morbidly obese, no tenderness, no masses palpated. No hepatosplenomegaly. Bowel sounds positive.  Musculoskeletal: no clubbing / cyanosis. No joint deformity upper and lower extremities. Good ROM, no contractures. Normal muscle tone.  Skin: no rashes, lesions, ulcers.  Neurologic: CN 2-12 grossly intact. Sensation intact, DTR normal. Strength 5/5 in all 4.  Psychiatric: Normal judgment and insight. Alert and oriented x 3. Normal mood.     Labs on Admission: I have personally reviewed following labs and imaging studies  CBC:  Recent Labs Lab 08/15/15 1436 08/15/15 1450  WBC 7.3  --   NEUTROABS 5.7  --   HGB 14.2  15.6*  HCT 43.5 46.0  MCV 90.6  --   PLT 201  --     Basic Metabolic Panel:  Recent Labs Lab 08/15/15 1436 08/15/15 1450  NA 136 141  K 3.6 3.8  CL 104 100*  CO2 28  --   GLUCOSE 152* 152*  BUN 16 16  CREATININE 0.88 0.90  CALCIUM 9.8  --     GFR: Estimated Creatinine Clearance: 89.1 mL/min (by C-G formula based on SCr of 0.9 mg/dL).  Liver Function Tests:  Recent Labs Lab 08/15/15 1436  AST 45*  ALT 33  ALKPHOS 56  BILITOT 0.9  PROT 8.5*  ALBUMIN 4.0   No results for input(s): LIPASE, AMYLASE in the last 168 hours. No results for input(s): AMMONIA in the last 168 hours.  Coagulation Profile:  Recent Labs Lab 08/15/15 1436  INR 0.97    Cardiac Enzymes: No results for input(s): CKTOTAL, CKMB, CKMBINDEX, TROPONINI in the last 168 hours.  BNP (last 3 results) No results for input(s): PROBNP in the last 8760 hours.  HbA1C: No results for input(s): HGBA1C in the last 72 hours.  CBG: No results for input(s): GLUCAP in the last 168 hours.  Lipid Profile: No results for input(s): CHOL, HDL, LDLCALC, TRIG, CHOLHDL, LDLDIRECT in the last 72 hours.  Thyroid Function Tests: No results for input(s): TSH, T4TOTAL, FREET4,  T3FREE, THYROIDAB in the last 72 hours.  Anemia Panel: No results for input(s): VITAMINB12, FOLATE, FERRITIN, TIBC, IRON, RETICCTPCT in the last 72 hours.  Urine analysis:    Component Value Date/Time   COLORURINE YELLOW 08/15/2015 [place] 08/15/2015 1528   LABSPEC 1.020 08/15/2015 1528   PHURINE 5.5 08/15/2015 1528   GLUCOSEU NEGATIVE 08/15/2015 1528   HGBUR TRACE (A) 08/15/2015 1528   BILIRUBINUR NEGATIVE 08/15/2015 [place] 08/15/2015 1528   PROTEINUR NEGATIVE 08/15/2015 1528   NITRITE NEGATIVE 08/15/2015 1528   LEUKOCYTESUR MODERATE (A) 08/15/2015 1528    Sepsis Labs: @LABRCNTIP (procalcitonin:4,lacticidven:4) )No results found for this or any previous visit (from the past 240 hour(s)).   Radiological Exams on Admission: Ct Head Wo Contrast  Result Date: 08/15/2015 CLINICAL DATA:  numbness in left arm and leg since yesterday. Denies confusion/dizziness/slurred speech/trouble speaking/trouble swallowing. History of HTN. EXAM: CT HEAD WITHOUT CONTRAST TECHNIQUE: Contiguous axial images were obtained from the base of the skull through the vertex without intravenous contrast. COMPARISON:  None. FINDINGS: There is minimal periventricular white matter change consistent with small vessel disease. There is no intra or extra-axial fluid collection or mass lesion. The basilar cisterns and ventricles have a normal appearance. There is no CT evidence for acute infarction or hemorrhage. Bone windows show no acute calvarial injury. The paranasal and mastoid air cells are normally aerated. IMPRESSION: 1.  No evidence for acute intracranial abnormality. 2. Mild changes of small vessel disease. Electronically Signed   By: Nolon Nations M.D.   On: 08/15/2015 16:14   Mr Brain Wo Contrast  Result Date: 08/16/2015 CLINICAL DATA:  Left facial droop, left arm and leg numbness, and numbness in both hands beginning 2 days ago. EXAM: MRI HEAD WITHOUT CONTRAST TECHNIQUE:  Multiplanar, multiecho pulse sequences of the brain and surrounding structures were obtained without intravenous contrast. COMPARISON:  Head CT 08/15/2015 FINDINGS: There is a 1 cm acute infarct in the right thalamus. There is no evidence of intracranial hemorrhage, mass, midline shift, or extra-axial fluid collection. Ventricles and sulci are normal for age. Patchy T2 hyperintensities in the  cerebral white matter bilaterally are nonspecific but compatible with mild-to-moderate chronic small vessel ischemic disease. There is a chronic lacunar infarct in the left centrum semiovale. Prior bilateral cataract extraction is noted. Paranasal sinuses and mastoid air cells are clear. Major intracranial vascular flow voids are preserved. No focal osseous lesion. IMPRESSION: 1. Acute right thalamic infarct. 2. Mild-to-moderate chronic small vessel ischemic disease. Electronically Signed   By: Logan Bores M.D.   On: 08/16/2015 14:11   Mr Cervical Spine Wo Contrast  Result Date: 08/16/2015 CLINICAL DATA:  Left arm and leg numbness. Left-sided neck pain with numbness in both hands. EXAM: MRI CERVICAL SPINE WITHOUT CONTRAST TECHNIQUE: Multiplanar, multisequence MR imaging of the cervical spine was performed. No intravenous contrast was administered. COMPARISON:  None. FINDINGS: The study is motion degraded throughout, mildly to moderately so on the axial sequences which limits accurate characterization of neural foraminal stenosis. Alignment: Normal. Vertebrae: Preserved vertebral body heights without evidence of fracture. Moderate disc space narrowing at C6-7 with mild type 2 endplate changes. No significant vertebral marrow edema or suspicious osseous lesion identified. Cord: Normal signal and morphology. Posterior Fossa, vertebral arteries, paraspinal tissues: Hypoplastic right vertebral artery. Disc levels: C2-3:  Moderate right facet arthrosis without stenosis. C3-4: Left uncovertebral spurring and moderate left facet  arthrosis result in moderate left neural foraminal stenosis without spinal stenosis. C4-5: Moderate left facet arthrosis results in at most mild left neural foraminal stenosis. No spinal stenosis. C5-6: Mild disc bulging asymmetric to the left, left uncovertebral spurring, and mild facet arthrosis result in mild left neural foraminal stenosis without spinal stenosis. C6-7: Disc bulging and moderate size left foraminal disc protrusion result in moderate to severe left neural foraminal stenosis with likely left C7 nerve impingement. No spinal stenosis. C7-T1:  Negative. IMPRESSION: 1. 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 2. Mild-to-moderate left-sided neural foraminal stenosis elsewhere as above. Electronically Signed   By: Logan Bores M.D.   On: 08/16/2015 14:22    EKG: Independently reviewed.  Assessment/Plan Active Problems:   Thalamic infarct, acute (HCC)   Sleep apnea   Hypothyroidism   Hypertension   Depression   Anxiety   UTI (lower urinary tract infection)   Morbid obesity (HCC)    Acute-Left sided numbness, with acute stroke involving the thalamogeniculate teritory, likely thrombotic  MRI confirmed R thalamic CVA.Permissive Hypertension was allowed.  (to treat for BP 220/110 - Admit to Tele / Inpatient Stroke order set was started Carotid dopplers Allow permissive HTN -Echo  -PT/OT/SLP  lipid panel  Check A1C Plavix  er Neuro recommendation Fall precautions -Neuro following  Social Work, likely to need inpatient rehab   Hypertension BP 138/87   Pulse 62   Resp 14   SpO2 97%  Controlled Continue home anti-hypertensive medications  Add Hydralazine Q6 hours as needed for BP 160/90   Hypothyroidism: -Continue home Synthroid   Anxiety/Depression Continue Xanax, Zoloft   PreDiabetes with peripheral neuropathy . Never been on antiglycemics  Current blood sugar  level is 152 Hgb A1C SSI B12 pending  Heart healthy carb modified diet. May need diabetes coordinator   Recent UTI, treated as OP w Keflex, UA with some bacteria. Afebrile. WBC normal. No dysuria or frequency  Continue course of Keflex    DVT prophylaxis: Lovenox  Code Status:   Full    Family Communication:  Discussed with patient  Disposition Plan: Expect patient to be discharged  to home after condition improves Consults called:   Neuro Following  Admission status: Inpatient tele  Rondel Jumbo, PA-C [facility]   If 7PM-7AM, please contact night-coverage [email or web address] Password [email or web address]  08/16/2015, 4:00 PM

## 2015-08-16 NOTE — Progress Notes (Signed)
Pt arrived from the ED alert, verbal with no noted distress. Pt stable, neuro intact.  She denies pain or discomfort. Pt denies numbness or tingling  to left side just decrease in sensation. Able to follow commands. Pt oriented to room. Safety measures in place. Call bell within reach. Family at bedside. Will continue to monitor.

## 2015-08-16 NOTE — ED Provider Notes (Signed)
[facility] DEPT Provider Note   CSN: [id number] Arrival date & time: 08/16/15  [id number]  First Provider Contact:  First MD Initiated Contact with Patient 08/16/15 [phone]        History   Chief Complaint Chief Complaint  Patient presents with  . Numbness    HPI Michelle Roth is a 65 y.o. female presents with left arm and leg numbness. Has been ongoing for the past 2 days. Patient states the numbness has not gotten any better or worse. Numbness is on lateral aspect of arm and leg. Was seen at [facility] last night where she had a negative CT scan and workup otherwise. However she was unable to fit in their MRI. She was offered [facility] and [facility] or to come here in the morning. She went home and then came back this morning. There is no weakness. Patient states she has chronic low back pain but this is not worse or new at this time. There is no headache or neck pain. No chest pain. She typically walks with a cane due to lower extremity issues from her severe back pain but this is not worse. No facial numbness, droop or slurred speech.  HPI  Past Medical History:  Diagnosis Date  . Anxiety   . Depression   . Dysrhythmia   . Hypertension   . Hypothyroidism   . Shortness of breath dyspnea   . Sleep apnea     Patient Active Problem List   Diagnosis Date Noted  . Weakness/Lt sided 08/15/2015    Past Surgical History:  Procedure Laterality Date  . ABDOMINAL HYSTERECTOMY    . APPENDECTOMY    . BREAST SURGERY Left    Lumpectomy/ Benign  . CATARACT EXTRACTION W/PHACO Right 05/10/2014   Procedure: CATARACT EXTRACTION PHACO AND INTRAOCULAR LENS PLACEMENT (IOC);  Surgeon: Rutherford Guys, MD;  Location: [facility];  Service: Ophthalmology;  Laterality: Right;  CDE:6.25  . CATARACT EXTRACTION W/PHACO Left 06/14/2014   Procedure: CATARACT EXTRACTION PHACO AND INTRAOCULAR LENS PLACEMENT (IOC);  Surgeon: Rutherford Guys, MD;  Location: [facility];  Service: Ophthalmology;  Laterality:  Left;  CDE:4.19  . CHOLECYSTECTOMY    . KNEE ARTHROPLASTY Right   . REPLACEMENT TOTAL KNEE Left   . TOTAL THYROIDECTOMY Bilateral   . URETHRAL CYST REMOVAL      OB History    No data available       Home Medications    Prior to Admission medications   Medication Sig Start Date End Date Taking? Authorizing Provider  ALPRAZolam Duanne Moron) 1 MG tablet Take 1 mg by mouth 4 (four) times daily.    Historical Provider, MD  aspirin EC 81 MG tablet Take 81 mg by mouth daily.    Historical Provider, MD  bisoprolol-hydrochlorothiazide [facility]) 2.5-6.25 MG per tablet Take 1 tablet by mouth daily.    Historical Provider, MD  cephALEXin (KEFLEX) 500 MG capsule Take 1 capsule (500 mg total) by mouth 3 (three) times daily. 08/15/15   Roxan Hockey, MD  HYDROcodone-acetaminophen (NORCO) 10-325 MG per tablet Take 1 tablet by mouth every 6 (six) hours as needed for moderate pain.    Historical Provider, MD  hydrOXYzine (VISTARIL) 25 MG capsule Take 25 mg by mouth every 6 (six) hours as needed for anxiety.    Historical Provider, MD  levothyroxine (SYNTHROID, LEVOTHROID) 150 MCG tablet Take 1 tablet by mouth daily. 08/09/15   Historical Provider, MD  sertraline (ZOLOFT) 100 MG tablet Take 150 mg by mouth  daily.    Historical Provider, MD    Family History No family history on file.  Social History Social History  Substance Use Topics  . Smoking status: Former Smoker    Packs/day: 0.25    Years: 16.00    Quit date: 05/05/1985  . Smokeless tobacco: Never Used  . Alcohol use No     Allergies   Biaxin [clarithromycin] and Macrobid [nitrofurantoin monohyd macro]   Review of Systems Review of Systems  Constitutional: Negative for fever.  Eyes: Negative for visual disturbance.  Respiratory: Negative for shortness of breath.   Cardiovascular: Negative for chest pain.  Gastrointestinal: Negative for abdominal pain.  Musculoskeletal: Positive for back pain. Negative for neck pain.  Neurological:  Positive for numbness. Negative for speech difficulty, weakness and headaches.  All other systems reviewed and are negative.    Physical Exam Updated Vital Signs BP 158/68 (BP Location: Left Arm)   Pulse 64   Resp 16   SpO2 99%   Physical Exam  Constitutional: She is oriented to person, place, and time. She appears well-developed and well-nourished.  HENT:  Head: Normocephalic and atraumatic.  Right Ear: External ear normal.  Left Ear: External ear normal.  Nose: Nose normal.  Eyes: EOM are normal. Pupils are equal, round, and reactive to light. Right eye exhibits no discharge. Left eye exhibits no discharge.  Cardiovascular: Normal rate, regular rhythm and normal heart sounds.   Pulses:      Radial pulses are 2+ on the right side, and 2+ on the left side.       Dorsalis pedis pulses are 2+ on the right side, and 2+ on the left side.  Pulmonary/Chest: Effort normal and breath sounds normal.  Abdominal: Soft. There is no tenderness.  Neurological: She is alert and oriented to person, place, and time.  CN 3-12 grossly intact. 5/5 strength in all 4 extremities. Subjective decreased sensation to lateral arm and lateral thigh. Normal finger to nose.   Skin: Skin is warm and dry.  Nursing note and vitals reviewed.    ED Treatments / Results  Labs (all labs ordered are listed, but only abnormal results are displayed) Labs Reviewed - No data to display  EKG  EKG Interpretation None       Radiology X-ray Chest Pa And Lateral  Result Date: 08/16/2015 CLINICAL DATA:  65 year old female with thalamic infarct. Left side numbness. Initial encounter. EXAM: CHEST  2 VIEW COMPARISON:  Chest radiographs 05/10/2005. FINDINGS: Mediastinal contours are stable since 2007, including mild prominence of the left hilum which is benign considering the stability. Other mediastinal contours are within normal limits. Visualized tracheal air column is within normal limits. No pneumothorax, pulmonary  edema, pleural effusion or confluent pulmonary opacity. Stable mild increased interstitial markings. No acute osseous abnormality identified. IMPRESSION: No acute cardiopulmonary abnormality. Electronically Signed   By: Genevie Ann M.D.   On: 08/16/2015 17:14   Ct Head Wo Contrast  Result Date: 08/15/2015 CLINICAL DATA:  numbness in left arm and leg since yesterday. Denies confusion/dizziness/slurred speech/trouble speaking/trouble swallowing. History of HTN. EXAM: CT HEAD WITHOUT CONTRAST TECHNIQUE: Contiguous axial images were obtained from the base of the skull through the vertex without intravenous contrast. COMPARISON:  None. FINDINGS: There is minimal periventricular white matter change consistent with small vessel disease. There is no intra or extra-axial fluid collection or mass lesion. The basilar cisterns and ventricles have a normal appearance. There is no CT evidence for acute infarction or hemorrhage. Bone windows show  no acute calvarial injury. The paranasal and mastoid air cells are normally aerated. IMPRESSION: 1.  No evidence for acute intracranial abnormality. 2. Mild changes of small vessel disease. Electronically Signed   By: Nolon Nations M.D.   On: 08/15/2015 16:14   Mr Brain Wo Contrast  Result Date: 08/16/2015 CLINICAL DATA:  Left facial droop, left arm and leg numbness, and numbness in both hands beginning 2 days ago. EXAM: MRI HEAD WITHOUT CONTRAST TECHNIQUE: Multiplanar, multiecho pulse sequences of the brain and surrounding structures were obtained without intravenous contrast. COMPARISON:  Head CT 08/15/2015 FINDINGS: There is a 1 cm acute infarct in the right thalamus. There is no evidence of intracranial hemorrhage, mass, midline shift, or extra-axial fluid collection. Ventricles and sulci are normal for age. Patchy T2 hyperintensities in the cerebral white matter bilaterally are nonspecific but compatible with mild-to-moderate chronic small vessel ischemic disease. There is a  chronic lacunar infarct in the left centrum semiovale. Prior bilateral cataract extraction is noted. Paranasal sinuses and mastoid air cells are clear. Major intracranial vascular flow voids are preserved. No focal osseous lesion. IMPRESSION: 1. Acute right thalamic infarct. 2. Mild-to-moderate chronic small vessel ischemic disease. Electronically Signed   By: Logan Bores M.D.   On: 08/16/2015 14:11   Mr Cervical Spine Wo Contrast  Result Date: 08/16/2015 CLINICAL DATA:  Left arm and leg numbness. Left-sided neck pain with numbness in both hands. EXAM: MRI CERVICAL SPINE WITHOUT CONTRAST TECHNIQUE: Multiplanar, multisequence MR imaging of the cervical spine was performed. No intravenous contrast was administered. COMPARISON:  None. FINDINGS: The study is motion degraded throughout, mildly to moderately so on the axial sequences which limits accurate characterization of neural foraminal stenosis. Alignment: Normal. Vertebrae: Preserved vertebral body heights without evidence of fracture. Moderate disc space narrowing at C6-7 with mild type 2 endplate changes. No significant vertebral marrow edema or suspicious osseous lesion identified. Cord: Normal signal and morphology. Posterior Fossa, vertebral arteries, paraspinal tissues: Hypoplastic right vertebral artery. Disc levels: C2-3:  Moderate right facet arthrosis without stenosis. C3-4: Left uncovertebral spurring and moderate left facet arthrosis result in moderate left neural foraminal stenosis without spinal stenosis. C4-5: Moderate left facet arthrosis results in at most mild left neural foraminal stenosis. No spinal stenosis. C5-6: Mild disc bulging asymmetric to the left, left uncovertebral spurring, and mild facet arthrosis result in mild left neural foraminal stenosis without spinal stenosis. C6-7: Disc bulging and moderate size left foraminal disc protrusion result in moderate to severe left neural foraminal stenosis with likely left C7 nerve  impingement. No spinal stenosis. C7-T1:  Negative. IMPRESSION: 1. 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 2. Mild-to-moderate left-sided neural foraminal stenosis elsewhere as above. Electronically Signed   By: Logan Bores M.D.   On: 08/16/2015 14:22    Procedures Procedures (including critical care time)  Medications Ordered in ED Medications - No data to display   Initial Impression / Assessment and Plan / ED Course  I have reviewed the triage vital signs and the nursing notes.  Pertinent labs & imaging results that were available during my care of the patient were reviewed by me and considered in my medical decision making (see chart for details).  Clinical Course  Comment By Time  Will order MRI brain/c-spine given no facial symptoms. Not a candidate for a code stroke Sherwood Gambler, MD 08/02 0945  MRI shows acute thalamic infarct. D/w Neuro, Dr. Shon Hale,  who will see, will admit to hospitalist for CVA w/u Sherwood Gambler, MD 08/02 1432  Dr. Marily Memos to admit to tele obs. Sherwood Gambler, MD 08/02 1453     Final Clinical Impressions(s) / ED Diagnoses   Final diagnoses:  Thalamic infarct, acute [facility])    New Prescriptions New Prescriptions   No medications on file     Sherwood Gambler, MD 08/16/15 1746

## 2015-08-16 NOTE — ED Notes (Signed)
Patient transported to MRI 

## 2015-08-16 NOTE — ED Triage Notes (Addendum)
Pt reports numbness to left arm down to left leg since Monday. Was at [facility] yesterday for work up but was unable to have an MRI due to size restriction. Agreed to come here this morning for MRI at [facility]. Left sided numbness to arm and leg remains unchanged. Pt is a x 4. Speech clear, concise, no facial droop. No drift. Pt had head CT and blood work yesterday at [facility].

## 2015-08-16 NOTE — ED Notes (Signed)
Pt BP cuff was malpositioned and after cuff fixed, pt not HTN.

## 2015-08-16 NOTE — Progress Notes (Signed)
Pt on telemetry monitoring.

## 2015-08-16 NOTE — ED Notes (Signed)
Patient transported to X-ray 

## 2015-08-16 NOTE — Consult Note (Signed)
Neurology Consult Note  Reason for Consultation: Subacute stroke  Requesting provider: Sherwood Gambler, MD  CC: numbness L arm and leg  HPI: This is a 65 year old right-handed woman who reports that on the morning of 08/14/15, she was at home making reads. She states that she suddenly became very anxious. She walked into the bathroom to urinate and realized that she had some numbness on the left arm and left leg. She states this only involved the outside of both arm and leg with normal sensation on the inside of the extremities. She denies any other symptoms, specifically no weakness, vision changes, double vision, vertigo, difficulty speaking, trouble swallowing, balance problems, or new gait abnormalities. She initially thought this was related to her chronic back problems. However, when symptoms persisted on the morning of 08/15/15, she called the nurse line and was told to go to the emergency department for evaluation. She went to [facility] and was told that she likely had suffered a small stroke. They were unable to perform MRI scan due to her body habitus and recommended that she be evaluated here in [facility]. She was presented with the option of being transferred to [facility] or being discharged to follow-up on her own. She chose to be discharged. She presents today for further evaluation.  She states that she has a known history of hypertension and high cholesterol. She has been told that she is diabetic in the past but was never given medication for this. She wanted to try to see if she could bring her glucose down on her own with diet and weight loss. She takes aspirin 81 mg daily.  PMH:  Past Medical History:  Diagnosis Date  . Anxiety   . Depression   . Dysrhythmia   . Hypertension   . Hypothyroidism   . Shortness of breath dyspnea   . Sleep apnea     PSH:  Past Surgical History:  Procedure Laterality Date  . ABDOMINAL HYSTERECTOMY    . APPENDECTOMY    . BREAST  SURGERY Left    Lumpectomy/ Benign  . CATARACT EXTRACTION W/PHACO Right 05/10/2014   Procedure: CATARACT EXTRACTION PHACO AND INTRAOCULAR LENS PLACEMENT (IOC);  Surgeon: Rutherford Guys, MD;  Location: [facility] ORS;  Service: Ophthalmology;  Laterality: Right;  CDE:6.25  . CATARACT EXTRACTION W/PHACO Left 06/14/2014   Procedure: CATARACT EXTRACTION PHACO AND INTRAOCULAR LENS PLACEMENT (IOC);  Surgeon: Rutherford Guys, MD;  Location: [facility];  Service: Ophthalmology;  Laterality: Left;  CDE:4.19  . CHOLECYSTECTOMY    . KNEE ARTHROPLASTY Right   . REPLACEMENT TOTAL KNEE Left   . TOTAL THYROIDECTOMY Bilateral   . URETHRAL CYST REMOVAL      Family history: Mother with history of abdominal aortic aneurysm and Alzheimer's disease. Her father died of ischemic bowel following surgery.  Social history:  Social History   Social History  . Marital status: Married    Spouse name: N/A  . Number of children: N/A  . Years of education: N/A   Occupational History  . Not on file.   Social History Main Topics  . Smoking status: Former Smoker    Packs/day: 0.25    Years: 16.00    Quit date: 05/05/1985  . Smokeless tobacco: Never Used  . Alcohol use No  . Drug use: No  . Sexual activity: Not on file   Other Topics Concern  . Not on file   Social History Narrative  . No narrative on file    Current outpatient  meds: Current Meds  Medication Sig  . ALPRAZolam (XANAX) 1 MG tablet Take 1 mg by mouth 4 (four) times daily.  Marland Kitchen aspirin EC 81 MG tablet Take 81 mg by mouth daily.  . bisoprolol-hydrochlorothiazide (ZIAC) 2.5-6.25 MG per tablet Take 1 tablet by mouth daily.  Marland Kitchen HYDROcodone-acetaminophen (NORCO) 10-325 MG per tablet Take 1 tablet by mouth every 6 (six) hours as needed for moderate pain.  Marland Kitchen levothyroxine (SYNTHROID, LEVOTHROID) 150 MCG tablet Take 150 mcg by mouth daily.   . sertraline (ZOLOFT) 100 MG tablet Take 50-100 mg by mouth daily. Take 50mg  every morning  Take 100mg  every night     Current inpatient meds:  No current facility-administered medications for this encounter.    Current Outpatient Prescriptions  Medication Sig Dispense Refill  . ALPRAZolam (XANAX) 1 MG tablet Take 1 mg by mouth 4 (four) times daily.    Marland Kitchen aspirin EC 81 MG tablet Take 81 mg by mouth daily.    . bisoprolol-hydrochlorothiazide (ZIAC) 2.5-6.25 MG per tablet Take 1 tablet by mouth daily.    Marland Kitchen HYDROcodone-acetaminophen (NORCO) 10-325 MG per tablet Take 1 tablet by mouth every 6 (six) hours as needed for moderate pain.    Marland Kitchen levothyroxine (SYNTHROID, LEVOTHROID) 150 MCG tablet Take 150 mcg by mouth daily.     . sertraline (ZOLOFT) 100 MG tablet Take 50-100 mg by mouth daily. Take 50mg  every morning  Take 100mg  every night    . cephALEXin (KEFLEX) 500 MG capsule Take 1 capsule (500 mg total) by mouth 3 (three) times daily. 15 capsule 0  . hydrOXYzine (VISTARIL) 25 MG capsule Take 25 mg by mouth every 6 (six) hours as needed for anxiety.      Allergies: Allergies  Allergen Reactions  . Biaxin [Clarithromycin]     Thrush   . Macrobid [Nitrofurantoin Monohyd Macro] Nausea Only    ROS: As per HPI. A full 14-point review of systems was performed and is otherwise Notable for chronic low back pain. She states that she has severe anxiety with frequent panic attacks and worries about everything. She states that she has palpitations with her anxiety. She has pain in her right knee from chronic arthritis and states that she uses a cane for ambulation because of her back and her knee.  PE:  BP 139/63   Pulse (!) 59   Resp 18   SpO2 97%   General: WD obese Caucasian woman lying on ED gurney. She is mildly anxious and at times briefly tearful. AAO x4. Speech clear, no dysarthria. No aphasia. Follows commands briskly. Affect is bright but anxious with congruent mood. Comportment is normal.  HEENT: Normocephalic. Neck supple without LAD. MMM, OP clear. Dentition good. Sclerae anicteric. No conjunctival  injection.  CV: Distant, regular, no murmur. Carotid pulses full and symmetric, no bruits. Distal pulses 2+ and symmetric.  Lungs: CTAB.  Abdomen: Soft, obese, non-distended, non-tender. Bowel sounds present x4.  Extremities: No C/C/E. Neuro:  CN: Pupils are equal and round. They are symmetrically reactive from 3-->2 mm. EOMI without nystagmus. No reported diplopia. Facial sensation is intact to light touch. Face is symmetric at rest with normal strength and mobility. Hearing is intact to conversational voice. Palate elevates symmetrically and uvula is midline. Voice is normal in tone, pitch and quality. Bilateral SCM and trapezii are 5/5. Tongue is midline with normal bulk and mobility.  Motor: Normal bulk, tone, and strength. No tremor or other abnormal movements. No drift.  Sensation: Intact to light touch and  pinprick. Vibration is mildly reduced at the joint of both great toes. DTRs: 2+, symmetric in the arms, absent in the legs. Toes downgoing bilaterally. No pathologic reflexes.  Coordination: Finger-to-nose is without dysmetria. Heel-to-shin is limited by back and knee pain. Finger taps are normal in amplitude and speed, no decrement.    Labs:  Lab Results  Component Value Date   WBC 7.3 08/15/2015   HGB 15.6 (H) 08/15/2015   HCT 46.0 08/15/2015   PLT 201 08/15/2015   GLUCOSE 152 (H) 08/15/2015   ALT 33 08/15/2015   AST 45 (H) 08/15/2015   NA 141 08/15/2015   K 3.8 08/15/2015   CL 100 (L) 08/15/2015   CREATININE 0.90 08/15/2015   BUN 16 08/15/2015   CO2 28 08/15/2015   INR 0.97 08/15/2015   Urinalysis is notable for trace hemoglobin and moderate facet esterase with 6-30 white blood cells and few bacteria.  Urine drug screen is positive for benzodiazepines (the patient takes Xanax at home)  Imaging:  I have personally and independently reviewed the MRI scan of the brain without contrast from 08/16/15. This shows a small area of restricted diffusion in the right thalamus,  consistent with an acute small vessel ischemic infarction. A moderate burden of chronic small vessel ischemic disease is present in the bihemispheric white matter. An old lacunar infarct is noted in the left centrum semiovale ovale.  MRI of cervical spine shows diffuse degenerative changes. There is disc bulging at C6-7 which results in significant narrowing of the left neural foramen with probable impingement on the C7 nerve root. Mild degree of multilevel neural foraminal stenosis seen throughout the cervical spine.   Assessment and Plan: 1. Acute Ischemic Stroke: This is an acute stroke involving the thalamogeniculate territory. It is most likely thrombotic in etiology, due to lipoma hyalinosis in the setting of long-standing hypertension. Known risk factors for cerebrovascular disease in this patient include hypertension, dyslipidemia, possible diabetes, morbid obesity. Carotid Dopplers, TTE, fasting lipids, and hemoglobin a1c will be ordered to complete stroke evaluation. Further testing will be determined by results from these initial studies. Recommend switching antiplatelet therapy to clopidogrel 75 mg daily for secondary stroke prevention since this event occurred while taking aspirin. Recommend adding atorvastatin 80 mg daily with goal LDL less than 70. Ensure adequate glucose control. Allow permissive hypertension in the acute phase, treating only SBP greater than 220 mmHg and/or DBP greater than 110 mmHg. BP can be gradually lowered to goal over the next 24 hours. Avoid fever and hyperglycemia as these can extend the infarct. Avoid hypotonic IVF to minimize exacerbation of post-stroke edema. Initiate rehab services. DVT prophylaxis as needed.   2. Left-sided numbness: This was her presenting complaint. However, on examination, I am unable to  identify any objective left-sided sensory loss. Continue supportive care.  3. Peripheral neuropathy: She demonstrates evidence of a symmetric large fiber  sensory polyneuropathy in both lower extremities. Given history as reported, I suspect this may be related to glucose intolerance or frank diabetes. Will check hemoglobin A1c and vitamin B12.  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 MRI scan today demonstrates some compression of the left C7 nerve root. However, this would not explain her presentation which is compatible with the ischemic infarction of the right thalamus noted on MRI. No acute issues with her cervical spine at this time. This can be followed as an outpatient.  This was discussed with the patient and her husband who was present at the bedside. They  are in agreement with the plan as noted. They were given the opportunity to ask any questions and these were addressed to their satisfaction.

## 2015-08-17 ENCOUNTER — Other Ambulatory Visit (HOSPITAL_COMMUNITY): Payer: Self-pay

## 2015-08-17 ENCOUNTER — Encounter (HOSPITAL_COMMUNITY): Payer: Self-pay | Admitting: Radiology

## 2015-08-17 ENCOUNTER — Observation Stay (HOSPITAL_COMMUNITY): Payer: Medicare Other

## 2015-08-17 DIAGNOSIS — Z9841 Cataract extraction status, right eye: Secondary | ICD-10-CM | POA: Diagnosis not present

## 2015-08-17 DIAGNOSIS — I6789 Other cerebrovascular disease: Secondary | ICD-10-CM | POA: Diagnosis not present

## 2015-08-17 DIAGNOSIS — E038 Other specified hypothyroidism: Secondary | ICD-10-CM | POA: Diagnosis not present

## 2015-08-17 DIAGNOSIS — N390 Urinary tract infection, site not specified: Secondary | ICD-10-CM | POA: Diagnosis present

## 2015-08-17 DIAGNOSIS — Z881 Allergy status to other antibiotic agents status: Secondary | ICD-10-CM | POA: Diagnosis not present

## 2015-08-17 DIAGNOSIS — Z96651 Presence of right artificial knee joint: Secondary | ICD-10-CM | POA: Diagnosis present

## 2015-08-17 DIAGNOSIS — Z79899 Other long term (current) drug therapy: Secondary | ICD-10-CM | POA: Diagnosis not present

## 2015-08-17 DIAGNOSIS — F329 Major depressive disorder, single episode, unspecified: Secondary | ICD-10-CM

## 2015-08-17 DIAGNOSIS — I10 Essential (primary) hypertension: Secondary | ICD-10-CM | POA: Diagnosis not present

## 2015-08-17 DIAGNOSIS — I63131 Cerebral infarction due to embolism of right carotid artery: Secondary | ICD-10-CM | POA: Diagnosis not present

## 2015-08-17 DIAGNOSIS — Z6841 Body Mass Index (BMI) 40.0 and over, adult: Secondary | ICD-10-CM | POA: Diagnosis not present

## 2015-08-17 DIAGNOSIS — I63331 Cerebral infarction due to thrombosis of right posterior cerebral artery: Secondary | ICD-10-CM | POA: Diagnosis not present

## 2015-08-17 DIAGNOSIS — Z9842 Cataract extraction status, left eye: Secondary | ICD-10-CM | POA: Diagnosis not present

## 2015-08-17 DIAGNOSIS — Z961 Presence of intraocular lens: Secondary | ICD-10-CM | POA: Diagnosis present

## 2015-08-17 DIAGNOSIS — E890 Postprocedural hypothyroidism: Secondary | ICD-10-CM | POA: Diagnosis present

## 2015-08-17 DIAGNOSIS — Z7982 Long term (current) use of aspirin: Secondary | ICD-10-CM | POA: Diagnosis not present

## 2015-08-17 DIAGNOSIS — I639 Cerebral infarction, unspecified: Secondary | ICD-10-CM

## 2015-08-17 DIAGNOSIS — G4730 Sleep apnea, unspecified: Secondary | ICD-10-CM | POA: Diagnosis present

## 2015-08-17 DIAGNOSIS — G4733 Obstructive sleep apnea (adult) (pediatric): Secondary | ICD-10-CM | POA: Diagnosis present

## 2015-08-17 DIAGNOSIS — Z87891 Personal history of nicotine dependence: Secondary | ICD-10-CM | POA: Diagnosis not present

## 2015-08-17 DIAGNOSIS — R29700 NIHSS score 0: Secondary | ICD-10-CM | POA: Diagnosis present

## 2015-08-17 DIAGNOSIS — Z90710 Acquired absence of both cervix and uterus: Secondary | ICD-10-CM | POA: Diagnosis not present

## 2015-08-17 DIAGNOSIS — G8194 Hemiplegia, unspecified affecting left nondominant side: Secondary | ICD-10-CM | POA: Diagnosis present

## 2015-08-17 DIAGNOSIS — F419 Anxiety disorder, unspecified: Secondary | ICD-10-CM | POA: Diagnosis present

## 2015-08-17 DIAGNOSIS — E785 Hyperlipidemia, unspecified: Secondary | ICD-10-CM | POA: Diagnosis present

## 2015-08-17 LAB — CBC
HCT: 38.2 % (ref 36.0–46.0)
Hemoglobin: 12 g/dL (ref 12.0–15.0)
MCH: 29.1 pg (ref 26.0–34.0)
MCHC: 31.4 g/dL (ref 30.0–36.0)
MCV: 92.5 fL (ref 78.0–100.0)
PLATELETS: 164 10*3/uL (ref 150–400)
RBC: 4.13 MIL/uL (ref 3.87–5.11)
RDW: 15.6 % — AB (ref 11.5–15.5)
WBC: 5.4 10*3/uL (ref 4.0–10.5)

## 2015-08-17 LAB — GLUCOSE, CAPILLARY
GLUCOSE-CAPILLARY: 120 mg/dL — AB (ref 65–99)
GLUCOSE-CAPILLARY: 110 mg/dL — AB (ref 65–99)
Glucose-Capillary: 123 mg/dL — ABNORMAL HIGH (ref 65–99)
Glucose-Capillary: 90 mg/dL (ref 65–99)

## 2015-08-17 LAB — PROTIME-INR
INR: 1.14
PROTHROMBIN TIME: 14.7 s (ref 11.4–15.2)

## 2015-08-17 LAB — COMPREHENSIVE METABOLIC PANEL
ALK PHOS: 42 U/L (ref 38–126)
ALT: 25 U/L (ref 14–54)
AST: 33 U/L (ref 15–41)
Albumin: 3.1 g/dL — ABNORMAL LOW (ref 3.5–5.0)
Anion gap: 5 (ref 5–15)
BUN: 13 mg/dL (ref 6–20)
CALCIUM: 9.3 mg/dL (ref 8.9–10.3)
CHLORIDE: 103 mmol/L (ref 101–111)
CO2: 30 mmol/L (ref 22–32)
CREATININE: 0.97 mg/dL (ref 0.44–1.00)
GFR calc non Af Amer: 59 mL/min — ABNORMAL LOW (ref >60)
GLUCOSE: 130 mg/dL — AB (ref 65–99)
Potassium: 4 mmol/L (ref 3.5–5.1)
SODIUM: 138 mmol/L (ref 135–145)
Total Bilirubin: 0.7 mg/dL (ref 0.3–1.2)
Total Protein: 6.2 g/dL — ABNORMAL LOW (ref 6.5–8.1)

## 2015-08-17 LAB — LIPID PANEL
Cholesterol: 203 mg/dL — ABNORMAL HIGH (ref 0–200)
HDL: 25 mg/dL — AB (ref >40)
LDL CALC: 155 mg/dL — AB (ref 0–99)
Total CHOL/HDL Ratio: 8.1 RATIO
Triglycerides: 114 mg/dL (ref <150)
VLDL: 23 mg/dL (ref 0–40)

## 2015-08-17 MED ORDER — IOPAMIDOL (ISOVUE-370) INJECTION 76%
INTRAVENOUS | Status: AC
  Administered 2015-08-17: 50 mL
  Filled 2015-08-17: qty 50

## 2015-08-17 NOTE — Progress Notes (Signed)
Speech Language Pathology  Patient Details Name: Michelle Roth MRN: [id number] DOB: [birth year] Today's Date: 08/17/2015 Time:  -      Swallow assessment ordered, however pt passed RN swallow screen.  ST not needed.    Orbie Pyo [place].Ed [employer] [phone]

## 2015-08-17 NOTE — Progress Notes (Signed)
Patient A/O, no noted distress. Denies pain. Tolerated med well. CBG WNL needed no coverage. Ambulated in room. Staff maintain safety and meet needs.

## 2015-08-17 NOTE — Care Management Note (Signed)
Case Management Note  Patient Details  Name: Michelle Roth MRN: [id number] Date of Birth: [birth year]  Subjective/Objective:  Pt admitted with CVA. She is from home alone.                    Action/Plan: PT recommending [facility]. Awaiting OT recs. CM following for discharge needs.   Expected Discharge Date:                  Expected Discharge Plan:  [facility]  In-House Referral:     Discharge planning Services     Post Acute Care Choice:    Choice offered to:     DME Arranged:    DME Agency:     HH Arranged:    [facility] Agency:     Status of Service:  In process, will continue to follow  If discussed at Long Length of Stay Meetings, dates discussed:    Additional Comments:  Pollie Friar, RN 08/17/2015, 1:53 PM

## 2015-08-17 NOTE — Evaluation (Signed)
Physical Therapy Evaluation Patient Details Name: FIORELA IZATT MRN: [id number] DOB: 1950-03-22 Today's Date: 08/17/2015   History of Present Illness  pt presents with R Thalamic Infarct with L UE worse than LE numbness.  pt with hx of Pancreatitis, HTN, Hepatitis, OSA, Anxiety, Depression, CVA, and L TKR.    Clinical Impression  Pt generally deconditioned and weak, but feel most of this is baseline for pt as only deficits at this time are decreased sensation in L UE and LE.  Dicussed with pt need for HHPT and continued therapy to maximize independence.  Will continue to follow.      Follow Up Recommendations Home health PT;Supervision - Intermittent    Equipment Recommendations  None recommended by PT    Recommendations for Other Services       Precautions / Restrictions Precautions Precautions: Fall Restrictions Weight Bearing Restrictions: No      Mobility  Bed Mobility Overal bed mobility: Modified Independent             General bed mobility comments: pt needs increased time and tends to use momentum, but no A needed.    Transfers Overall transfer level: Needs assistance Equipment used: None Transfers: Sit to/from Stand Sit to Stand: Supervision         General transfer comment: pt with heavy use of UEs for coming to/from standing.    Ambulation/Gait Ambulation/Gait assistance: Min assist Ambulation Distance (Feet): 120 Feet Assistive device: 1 person hand held assist Gait Pattern/deviations: Step-through pattern;Decreased stride length;Wide base of support     General Gait Details: PT offered to get cane, but pt repeatedly declined and just asked to hold PT's hand.  pt with moderate lateral sway due to body habitus and indicates mobility limited by fatigue in her back.    Stairs            Wheelchair Mobility    Modified Rankin (Stroke Patients Only) Modified Rankin (Stroke Patients Only) Pre-Morbid Rankin Score: Moderate disability Modified  Rankin: Moderately severe disability     Balance Overall balance assessment: Needs assistance Sitting-balance support: No upper extremity supported;Feet supported Sitting balance-Leahy Scale: Good     Standing balance support: No upper extremity supported;During functional activity Standing balance-Leahy Scale: Fair                               Pertinent Vitals/Pain Pain Assessment: 0-10 Pain Score: 4  Pain Location: Back Pain Descriptors / Indicators: Sore Pain Intervention(s): Monitored during session;Repositioned    Home Living Family/patient expects to be discharged to:: Private residence Living Arrangements: Spouse/significant other Available Help at Discharge: Family;Available 24 hours/day Type of Home: House Home Access: Stairs to enter Entrance Stairs-Rails: Psychiatric nurse of Steps: 2 Home Layout: One level Home Equipment: Walker - 2 wheels;Walker - 4 wheels;Cane - single point;Shower seat;Wheelchair - manual      Prior Function Level of Independence: Needs assistance   Gait / Transfers Assistance Needed: Uses cane for short distances and W/C for long distances.    ADL's / Homemaking Assistance Needed: pt gets A with homemaking tasks and is able to perform herown ADLs except needs A with socks and shoes.          Hand Dominance        Extremity/Trunk Assessment   Upper Extremity Assessment: Defer to OT evaluation           Lower Extremity Assessment: Generalized weakness  Cervical / Trunk Assessment: Kyphotic;Other exceptions  Communication   Communication: No difficulties  Cognition Arousal/Alertness: Awake/alert Behavior During Therapy: WFL for tasks assessed/performed Overall Cognitive Status: Within Functional Limits for tasks assessed                      General Comments      Exercises        Assessment/Plan    PT Assessment Patient needs continued PT services  PT Diagnosis  Difficulty walking   PT Problem List Decreased strength;Decreased activity tolerance;Decreased balance;Decreased mobility;Decreased coordination;Decreased knowledge of use of DME;Obesity  PT Treatment Interventions DME instruction;Gait training;Stair training;Functional mobility training;Therapeutic activities;Therapeutic exercise;Balance training;Neuromuscular re-education;Patient/family education   PT Goals (Current goals can be found in the Care Plan section) Acute Rehab PT Goals Patient Stated Goal: Home ASAP PT Goal Formulation: With patient Time For Goal Achievement: 08/24/15 Potential to Achieve Goals: Good    Frequency Min 4X/week   Barriers to discharge        Co-evaluation               End of Session Equipment Utilized During Treatment: Gait belt Activity Tolerance: Patient limited by fatigue Patient left: in bed;with call bell/phone within reach;with family/visitor present (Sitting EOB) Nurse Communication: Mobility status    Functional Assessment Tool Used: Clinical Judgement Functional Limitation: Mobility: Walking and moving around Mobility: Walking and Moving Around Current Status [id number]): At least 1 percent but less than 20 percent impaired, limited or restricted Mobility: Walking and Moving Around Goal Status [phone]): 0 percent impaired, limited or restricted    Time: [id number] PT Time Calculation (min) (ACUTE ONLY): 19 min   Charges:   PT Evaluation $PT Eval Low Complexity: 1 Procedure     PT G Codes:   PT G-Codes **NOT FOR INPATIENT CLASS** Functional Assessment Tool Used: Clinical Judgement Functional Limitation: Mobility: Walking and moving around Mobility: Walking and Moving Around Current Status [id number]): At least 1 percent but less than 20 percent impaired, limited or restricted Mobility: Walking and Moving Around Goal Status [phone]): 0 percent impaired, limited or restricted    Catarina Hartshorn, [place] 08/17/2015, 11:38 AM

## 2015-08-17 NOTE — Progress Notes (Signed)
PROGRESS NOTE    Michelle Roth  [id number] DOB: 11-Jul-1950 DOA: 08/16/2015 PCP: Glo Herring., MD   Brief Narrative:  Focal neurologic deficit, left sided weakness. 65 yo female with htn, hypothyroid, obesity.    Assessment & Plan:   Active Problems:   Thalamic infarct, acute (HCC)   Sleep apnea   Hypothyroidism   Hypertension   Depression   Anxiety   UTI (lower urinary tract infection)   Morbid obesity ([place])   1. TIA. Will continue neuro checks, follow on neurologic work up. Cervical MRI with disc disease, follow on echocardiogram and neurology recommendations. Follow on pt evaluation. MRI positive for right thalamic infarct. Will continue antiplatelet therapy with clopidogrel and asa.  2. HTN. Blood pressure systolic [id number], continue with as needed hydralazine, allow permissive hypertension due to recent cva.  3. Hypothyroid. Will continue levothyroxine, patient clinically euthyroid.  4. UTI. Will continue antibiotic therapy with cephalexin. Admission UA with 6 to 30 wbc.  5. Depression. Continue sertraline.    DVT prophylaxis: lovenox Code Status: full Family Communication: I spoke with patient's family at the beside and all questions were addressed. Disposition Plan:.   Consultants:   neurology  Procedures:   Antimicrobials: keflex  Subjective: Patient feeling better, recovering strength, no nausea or vomiting, tolerating po well.   Objective: Vitals:   08/17/15 0415 08/17/15 0631 08/17/15 0927 08/17/15 1432  BP: (!) 115/46 (!) 111/57 128/63 115/76  Pulse: (!) 55 (!) 53 (!) 59 60  Resp: 16 16 19 19   Temp: 97.8 F (36.6 C) 97.8 F (36.6 C) 98 F (36.7 C) 98.8 F (37.1 C)  TempSrc: Oral Oral Oral Axillary  SpO2: 97% 98% 99% 99%  Weight:      Height:        Intake/Output Summary (Last 24 hours) at 08/17/15 1639 Last data filed at 08/17/15 0900  Gross per 24 hour  Intake          1571.21 ml  Output                0 ml  Net           1571.21 ml   Filed Weights   08/16/15 1630  Weight: (!) 145.2 kg (320 lb 1.6 oz)    Examination:  General exam: not in pain E ENT: no conjunctival pallor, oral mucosa moist. Respiratory system: Clear to auscultation. Respiratory effort normal. Cardiovascular system: S1 & S2 heard, RRR. No JVD, murmurs, rubs, gallops or clicks. No pedal edema. Gastrointestinal system: Abdomen is nondistended, soft and nontender. No organomegaly or masses felt. Normal bowel sounds heard. Central nervous system: Alert and oriented. No focal neurological deficits. Extremities: Symmetric 5 x 5 power. Skin: No rashes, lesions or ulcers      Data Reviewed: I have personally reviewed following labs and imaging studies  CBC:  Recent Labs Lab 08/15/15 1436 08/15/15 1450 08/17/15 0612  WBC 7.3  --  5.4  NEUTROABS 5.7  --   --   HGB 14.2 15.6* 12.0  HCT 43.5 46.0 38.2  MCV 90.6  --  92.5  PLT 201  --  123456   Basic Metabolic Panel:  Recent Labs Lab 08/15/15 1436 08/15/15 1450 08/17/15 0612  NA 136 141 138  K 3.6 3.8 4.0  CL 104 100* 103  CO2 28  --  30  GLUCOSE 152* 152* 130*  BUN 16 16 13   CREATININE 0.88 0.90 0.97  CALCIUM 9.8  --  9.3   GFR: Estimated  Creatinine Clearance: 83.2 mL/min (by C-G formula based on SCr of 0.97 mg/dL). Liver Function Tests:  Recent Labs Lab 08/15/15 1436 08/17/15 0612  AST 45* 33  ALT 33 25  ALKPHOS 56 42  BILITOT 0.9 0.7  PROT 8.5* 6.2*  ALBUMIN 4.0 3.1*   No results for input(s): LIPASE, AMYLASE in the last 168 hours. No results for input(s): AMMONIA in the last 168 hours. Coagulation Profile:  Recent Labs Lab 08/15/15 1436 08/17/15 0612  INR 0.97 1.14   Cardiac Enzymes: No results for input(s): CKTOTAL, CKMB, CKMBINDEX, TROPONINI in the last 168 hours. BNP (last 3 results) No results for input(s): PROBNP in the last 8760 hours. HbA1C: No results for input(s): HGBA1C in the last 72 hours. CBG:  Recent Labs Lab 08/16/15 1719  08/16/15 2019 08/17/15 0630 08/17/15 1153 08/17/15 1625  GLUCAP 136* 119* 123* 120* 110*   Lipid Profile:  Recent Labs  08/17/15 0612  CHOL 203*  HDL 25*  LDLCALC 155*  TRIG 114  CHOLHDL 8.1   Thyroid Function Tests: No results for input(s): TSH, T4TOTAL, FREET4, T3FREE, THYROIDAB in the last 72 hours. Anemia Panel:  Recent Labs  08/16/15 1638  VITAMINB12 156*   Sepsis Labs: No results for input(s): PROCALCITON, LATICACIDVEN in the last 168 hours.  No results found for this or any previous visit (from the past 240 hour(s)).       Radiology Studies: Ct Angio Head W Or Wo Contrast  Result Date: 08/17/2015 CLINICAL DATA:  Left-sided numbness.  Right thalamic infarct EXAM: CT ANGIOGRAPHY HEAD AND NECK TECHNIQUE: Multidetector CT imaging of the head and neck was performed using the standard protocol during bolus administration of intravenous contrast. Multiplanar CT image reconstructions and MIPs were obtained to evaluate the vascular anatomy. Carotid stenosis measurements (when applicable) are obtained utilizing NASCET criteria, using the distal internal carotid diameter as the denominator. CONTRAST:  50 cc Isovue 370 intravenous COMPARISON:  Brain MRI from yesterday FINDINGS: CT HEAD Brain: Known right thalamic lacunar infarct without hemorrhage. No evidence of new/interval infarct. Mild chronic white matter disease. Calvarium and skull base: Negative Paranasal sinuses: Clear Orbits: Bilateral cataract resection. 15 mm left parotid nodule. CTA NECK Aortic arch: Atheromatous calcification.  Three vessel branching. Right carotid system: Atherosclerotic calcification greatest about the bifurcation with proximal ICA 50-60% stenosis, overestimated due to blooming. No evidence of dissection or plaque ulceration. There are two mid ICA kinks with moderate narrowing st the proximal tortuosity. Left carotid system: Comparatively small left carotid circulation due to aplastic left A1 segment.  Atherosclerotic calcification mainly about the bifurcation without stenosis. No dissection or plaque ulceration. Vertebral arteries:No proximal subclavian artery stenosis. Strong left vertebral artery dominance. Left vertebral artery is smooth and widely patent. Tiny right vertebral artery which matches its vertebral foramen. The vessel is intermittently nonvisualized or poorly visualized - it is not seen beyond the dura. Skeleton: Multilevel cervical disc and facet degeneration. Recent cervical spine MRI. 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 Other neck: 15 mm left parotid nodule. Upper chest: Clear apical lungs. CTA HEAD Anterior circulation: Small left ICA in the setting of aplastic left A1 segment. Carotid siphon atherosclerosis without stenosis. No major branch occlusion, flow limiting stenosis, or beading. Negative for aneurysm. Posterior circulation: Strong left dominance. There is flow into the right PICA from the left. Otherwise, unremarkable vertebrobasilar branching. There is moderate atherosclerotic type narrowing of the right P2 segment. No major branch occlusion. Negative for aneurysm.  Venous sinuses: Patent Anatomic variants: Aplastic left A1 segment Delayed phase: No parenchymal enhancement or mass. IMPRESSION: 1. Congenitally hypoplastic right vertebral artery is intermittently poorly visualized. This may be related to artifact superimposed on small vessel size, but dissection is also possible if there is right neck pain. The vessels is not seen beyond the dura; the right PICA via the left vertebral artery. 2. Focal moderate atheromatous type narrowing of the right P2 segment, notable given site of acute infarct. 3. Atherosclerosis. Proximal right ICA stenosis measures 50-60%, but is overestimated due to calcium blooming. Right mid ICA is narrowed by kinking. 4. Acute right thalamus infarct.  No hemorrhage or enlargement. 5. 15 mm left parotid mass.   Recommend outpatient ENT referral. Electronically Signed   By: Monte Fantasia M.D.   On: 08/17/2015 12:49   X-ray Chest Pa And Lateral  Result Date: 08/16/2015 CLINICAL DATA:  65 year old female with thalamic infarct. Left side numbness. Initial encounter. EXAM: CHEST  2 VIEW COMPARISON:  Chest radiographs 05/10/2005. FINDINGS: Mediastinal contours are stable since 2007, including mild prominence of the left hilum which is benign considering the stability. Other mediastinal contours are within normal limits. Visualized tracheal air column is within normal limits. No pneumothorax, pulmonary edema, pleural effusion or confluent pulmonary opacity. Stable mild increased interstitial markings. No acute osseous abnormality identified. IMPRESSION: No acute cardiopulmonary abnormality. Electronically Signed   By: Genevie Ann M.D.   On: 08/16/2015 17:14   Ct Angio Neck W Or Wo Contrast  Result Date: 08/17/2015 CLINICAL DATA:  Left-sided numbness.  Right thalamic infarct EXAM: CT ANGIOGRAPHY HEAD AND NECK TECHNIQUE: Multidetector CT imaging of the head and neck was performed using the standard protocol during bolus administration of intravenous contrast. Multiplanar CT image reconstructions and MIPs were obtained to evaluate the vascular anatomy. Carotid stenosis measurements (when applicable) are obtained utilizing NASCET criteria, using the distal internal carotid diameter as the denominator. CONTRAST:  50 cc Isovue 370 intravenous COMPARISON:  Brain MRI from yesterday FINDINGS: CT HEAD Brain: Known right thalamic lacunar infarct without hemorrhage. No evidence of new/interval infarct. Mild chronic white matter disease. Calvarium and skull base: Negative Paranasal sinuses: Clear Orbits: Bilateral cataract resection. 15 mm left parotid nodule. CTA NECK Aortic arch: Atheromatous calcification.  Three vessel branching. Right carotid system: Atherosclerotic calcification greatest about the bifurcation with proximal ICA  50-60% stenosis, overestimated due to blooming. No evidence of dissection or plaque ulceration. There are two mid ICA kinks with moderate narrowing st the proximal tortuosity. Left carotid system: Comparatively small left carotid circulation due to aplastic left A1 segment. Atherosclerotic calcification mainly about the bifurcation without stenosis. No dissection or plaque ulceration. Vertebral arteries:No proximal subclavian artery stenosis. Strong left vertebral artery dominance. Left vertebral artery is smooth and widely patent. Tiny right vertebral artery which matches its vertebral foramen. The vessel is intermittently nonvisualized or poorly visualized - it is not seen beyond the dura. Skeleton: Multilevel cervical disc and facet degeneration. Recent cervical spine MRI. 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 Other neck: 15 mm left parotid nodule. Upper chest: Clear apical lungs. CTA HEAD Anterior circulation: Small left ICA in the setting of aplastic left A1 segment. Carotid siphon atherosclerosis without stenosis. No major branch occlusion, flow limiting stenosis, or beading. Negative for aneurysm. Posterior circulation: Strong left dominance. There is flow into the right PICA from the left. Otherwise, unremarkable vertebrobasilar branching. There is moderate atherosclerotic type narrowing of the right P2 segment.  No major branch occlusion. Negative for aneurysm. Venous sinuses: Patent Anatomic variants: Aplastic left A1 segment Delayed phase: No parenchymal enhancement or mass. IMPRESSION: 1. Congenitally hypoplastic right vertebral artery is intermittently poorly visualized. This may be related to artifact superimposed on small vessel size, but dissection is also possible if there is right neck pain. The vessels is not seen beyond the dura; the right PICA via the left vertebral artery. 2. Focal moderate atheromatous type narrowing of the right P2 segment,  notable given site of acute infarct. 3. Atherosclerosis. Proximal right ICA stenosis measures 50-60%, but is overestimated due to calcium blooming. Right mid ICA is narrowed by kinking. 4. Acute right thalamus infarct.  No hemorrhage or enlargement. 5. 15 mm left parotid mass.  Recommend outpatient ENT referral. Electronically Signed   By: Monte Fantasia M.D.   On: 08/17/2015 12:49   Mr Brain Wo Contrast  Result Date: 08/16/2015 CLINICAL DATA:  Left facial droop, left arm and leg numbness, and numbness in both hands beginning 2 days ago. EXAM: MRI HEAD WITHOUT CONTRAST TECHNIQUE: Multiplanar, multiecho pulse sequences of the brain and surrounding structures were obtained without intravenous contrast. COMPARISON:  Head CT 08/15/2015 FINDINGS: There is a 1 cm acute infarct in the right thalamus. There is no evidence of intracranial hemorrhage, mass, midline shift, or extra-axial fluid collection. Ventricles and sulci are normal for age. Patchy T2 hyperintensities in the cerebral white matter bilaterally are nonspecific but compatible with mild-to-moderate chronic small vessel ischemic disease. There is a chronic lacunar infarct in the left centrum semiovale. Prior bilateral cataract extraction is noted. Paranasal sinuses and mastoid air cells are clear. Major intracranial vascular flow voids are preserved. No focal osseous lesion. IMPRESSION: 1. Acute right thalamic infarct. 2. Mild-to-moderate chronic small vessel ischemic disease. Electronically Signed   By: Logan Bores M.D.   On: 08/16/2015 14:11   Mr Cervical Spine Wo Contrast  Result Date: 08/16/2015 CLINICAL DATA:  Left arm and leg numbness. Left-sided neck pain with numbness in both hands. EXAM: MRI CERVICAL SPINE WITHOUT CONTRAST TECHNIQUE: Multiplanar, multisequence MR imaging of the cervical spine was performed. No intravenous contrast was administered. COMPARISON:  None. FINDINGS: The study is motion degraded throughout, mildly to moderately so on  the axial sequences which limits accurate characterization of neural foraminal stenosis. Alignment: Normal. Vertebrae: Preserved vertebral body heights without evidence of fracture. Moderate disc space narrowing at C6-7 with mild type 2 endplate changes. No significant vertebral marrow edema or suspicious osseous lesion identified. Cord: Normal signal and morphology. Posterior Fossa, vertebral arteries, paraspinal tissues: Hypoplastic right vertebral artery. Disc levels: C2-3:  Moderate right facet arthrosis without stenosis. C3-4: Left uncovertebral spurring and moderate left facet arthrosis result in moderate left neural foraminal stenosis without spinal stenosis. C4-5: Moderate left facet arthrosis results in at most mild left neural foraminal stenosis. No spinal stenosis. C5-6: Mild disc bulging asymmetric to the left, left uncovertebral spurring, and mild facet arthrosis result in mild left neural foraminal stenosis without spinal stenosis. C6-7: Disc bulging and moderate size left foraminal disc protrusion result in moderate to severe left neural foraminal stenosis with likely left C7 nerve impingement. No spinal stenosis. C7-T1:  Negative. IMPRESSION: 1. 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 2. Mild-to-moderate left-sided neural foraminal stenosis elsewhere as above. Electronically Signed   By: Logan Bores M.D.   On: 08/16/2015 14:22        Scheduled  Meds: . ALPRAZolam  1 mg Oral QID  . aspirin EC  81 mg Oral Daily  . atorvastatin  80 mg Oral q1800  . cephALEXin  500 mg Oral TID  . clopidogrel  75 mg Oral Daily  . enoxaparin (LOVENOX) injection  40 mg Subcutaneous Q24H  . insulin aspart  0-9 Units Subcutaneous TID WC  . levothyroxine  150 mcg Oral QAC breakfast  . sertraline  100 mg Oral QHS  . sertraline  50 mg Oral Daily  . sodium chloride flush  3 mL Intravenous Q12H    Continuous Infusions: . sodium chloride 50 mL/hr at 08/16/15 1630     LOS: 0 days        Mauricio Gerome Apley, MD [facility] Pager [phone]  If 7PM-7AM, please contact night-coverage [email or web address] Password [facility] 08/17/2015, 4:39 PM

## 2015-08-17 NOTE — Progress Notes (Signed)
STROKE TEAM PROGRESS NOTE   HISTORY OF PRESENT ILLNESS (per record) This is a 65 year old right-handed woman who reports that on the morning of 08/14/15, she was at home making reads. She states that she suddenly became very anxious. She walked into the bathroom to urinate and realized that she had some numbness on the left arm and left leg. She states this only involved the outside of both arm and leg with normal sensation on the inside of the extremities. She denies any other symptoms, specifically no weakness, vision changes, double vision, vertigo, difficulty speaking, trouble swallowing, balance problems, or new gait abnormalities. She initially thought this was related to her chronic back problems. However, when symptoms persisted on the morning of 08/15/15, she called the nurse line and was told to go to the emergency department for evaluation. She went to [facility] and was told that she likely had suffered a small stroke. They were unable to perform MRI scan due to her body habitus and recommended that she be evaluated here in [facility]. She was presented with the option of being transferred to [facility] or being discharged to follow-up on her own. She chose to be discharged. She presents today for further evaluation.  She states that she has a known history of hypertension and high cholesterol. She has been told that she is diabetic in the past but was never given medication for this. She wanted to try to see if she could bring her glucose down on her own with diet and weight loss. She takes aspirin 81 mg daily. Patient was not administered IV t-PA. She was admitted for further evaluation and treatment.   SUBJECTIVE (INTERVAL HISTORY) Her husband and son are at the bedside.  Overall she feels her condition is gradually improving. Stated that she has anxiety all the time and taking Xanax 3 times a day. 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 Has not have sleep study  done before, denies snoring but admit daytime sleepiness.    OBJECTIVE Temp:  [97.7 F (36.5 C)-98.3 F (36.8 C)] 98 F (36.7 C) (08/03 0927) Pulse Rate:  [52-64] 59 (08/03 0927) Cardiac Rhythm: Normal sinus rhythm (08/03 0930) Resp:  [10-19] 19 (08/03 0927) BP: (99-148)/(46-108) 128/63 (08/03 0927) SpO2:  [96 %-99 %] 99 % (08/03 0927) Weight:  [145.2 kg (320 lb 1.6 oz)] 145.2 kg (320 lb 1.6 oz) (08/02 1630)  CBC:  Recent Labs Lab 08/15/15 1436 08/15/15 1450 08/17/15 0612  WBC 7.3  --  5.4  NEUTROABS 5.7  --   --   HGB 14.2 15.6* 12.0  HCT 43.5 46.0 38.2  MCV 90.6  --  92.5  PLT 201  --  123456    Basic Metabolic Panel:  Recent Labs Lab 08/15/15 1436 08/15/15 1450 08/17/15 0612  NA 136 141 138  K 3.6 3.8 4.0  CL 104 100* 103  CO2 28  --  30  GLUCOSE 152* 152* 130*  BUN 16 16 13   CREATININE 0.88 0.90 0.97  CALCIUM 9.8  --  9.3    Lipid Panel:    Component Value Date/Time   CHOL 203 (H) 08/17/2015 0612   TRIG 114 08/17/2015 0612   HDL 25 (L) 08/17/2015 0612   CHOLHDL 8.1 08/17/2015 0612   VLDL 23 08/17/2015 0612   LDLCALC 155 (H) 08/17/2015 0612   HgbA1c: No results found for: HGBA1C Urine Drug Screen:    Component Value Date/Time   LABOPIA NONE DETECTED 08/15/2015 1527  COCAINSCRNUR NONE DETECTED 08/15/2015 1527   LABBENZ POSITIVE (A) 08/15/2015 1527   AMPHETMU NONE DETECTED 08/15/2015 1527   THCU NONE DETECTED 08/15/2015 1527   LABBARB NONE DETECTED 08/15/2015 1527      IMAGING I have personally reviewed the radiological images below and agree with the radiology interpretations.  X-ray Chest Pa And Lateral 08/16/2015 No acute cardiopulmonary abnormality.   Ct Head Wo Contrast 08/15/2015 1.  No evidence for acute intracranial abnormality. 2. Mild changes of small vessel disease.   Ct Angio Head & Neck W Or Wo Contrast 08/17/2015 1. Congenitally hypoplastic right vertebral artery is intermittently poorly visualized. This may be related to artifact  superimposed on small vessel size, but dissection is also possible if there is right neck pain. The vessels is not seen beyond the dura; the right PICA via the left vertebral artery. 2. Focal moderate atheromatous type narrowing of the right P2 segment, notable given site of acute infarct. 3. Atherosclerosis. Proximal right ICA stenosis measures 50-60%, but is overestimated due to calcium blooming. Right mid ICA is narrowed by kinking. 4. Acute right thalamus infarct.  No hemorrhage or enlargement. 5. 15 mm left parotid mass.  Recommend outpatient ENT referral.   Mr Brain Wo Contrast 08/16/2015 1. Acute right thalamic infarct. 2. Mild-to-moderate chronic small vessel ischemic disease.   Mr Cervical Spine Wo Contrast 08/16/2015 1. 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 2. Mild-to-moderate left-sided neural foraminal stenosis elsewhere   TTE - pending   PHYSICAL EXAM  Temp:  [97.7 F (36.5 C)-98.8 F (37.1 C)] 98.8 F (37.1 C) (08/03 1432) Pulse Rate:  [52-64] 60 (08/03 1432) Resp:  [16-19] 19 (08/03 1432) BP: (99-137)/(46-86) 115/76 (08/03 1432) SpO2:  [97 %-99 %] 99 % (08/03 1432) Weight:  [320 lb 1.6 oz (145.2 kg)] 320 lb 1.6 oz (145.2 kg) (08/02 1630)  General - morbid obesity, well developed, in no apparent distress.  Ophthalmologic - Sharp disc margins OU.  Cardiovascular - Regular rate and rhythm.  Mental Status -  Level of arousal and orientation to time, place, and person were intact. Language including expression, naming, repetition, comprehension was assessed and found intact. Fund of Knowledge was assessed and was intact.  Cranial Nerves II - XII - II - Visual field intact OU. III, IV, VI - Extraocular movements intact. V - Facial sensation intact bilaterally. VII - Facial movement intact bilaterally. VIII - Hearing & vestibular intact bilaterally. X - Palate  elevates symmetrically. XI - Chin turning & shoulder shrug intact bilaterally. XII - Tongue protrusion intact.  Motor Strength - The patient's strength was normal in all extremities and pronator drift was absent.  Bulk was normal and fasciculations were absent.   Motor Tone - Muscle tone was assessed at the neck and appendages and was normal.  Reflexes - The patient's reflexes were 1+ in all extremities and she had no pathological reflexes.  Sensory - Light touch, temperature/pinprick were assessed and were symmetrical except left lateral arm decreased sensation, 80% of the right.    Coordination - The patient had normal movements in the hands and feet with no ataxia or dysmetria.  Tremor was absent.  Gait and Station - The patient's transfers, posture, gait, station, and turns were observed as normal.   ASSESSMENT/PLAN Ms. ARISSA ARDELL is a 65 y.o. female with history of pancreatitis attacks, HTN, hepatitis, prediabetes, sleep abnormality presenting with L arm and  leg numbness. She did not receive IV t-PA due to delay in arrival.   Stroke:   right thalamic infarct secondary to small vessel disease source  Resultant  Left arm and leg numbness - improving  MRI  R thalamic infarct  MR Cervical spine likely C7 nerve impingement, mild foraminal stenosis  CTA head and neck  Focal R P2 narrowing, R ICA 50-60%. L parotid mass.  2D Echo  pending   LDL 155  HgbA1c pending  Lovenox 40 mg sq daily for VTE prophylaxis  Diet Carb Modified Fluid consistency: Thin; Room service appropriate? Yes  aspirin 81 mg daily prior to admission, now on aspirin 81 mg daily and clopidogrel 75 mg daily. Due to intracranial stenosis, OK with DAPT for 3 months and then plavix alone.  Ongoing aggressive stroke risk factor management  Therapy recommendations:  HH PT  Disposition:  Anticipate return home  Hypertension  Stable  Permissive hypertension (OK if < 220/120) but gradually normalize in 5-7  days  Long-term BP goal normotensive  Hyperlipidemia  Home meds:  No statin  LDL 155, goal < 70  Now on lipitor 80  Continue statin at discharge  Left C7 nerve impingement   Follow up as outpt with PCP  HH PT to address also  Other Stroke Risk Factors  Advanced age  Former Cigarette smoker  Morbid Obesity, Body mass index is 51.67 kg/m., recommend weight loss, diet and exercise as appropriate   Pre pt, 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 Does not snore. RECOMMEND OP sleep study. Will address as an OP during clinic follow up  Other Active Problems  Insomnia  Anxiety/Depression - on Xanax tid, may consider replace xanax with clonazepam as outpt  Hypothyroidism  Recent UTI  R parotid mass, needs ENT follow up  Hospital day # 0  Neurology will sign off. Please call with questions. Pt will follow up with Dr. Erlinda Hong at [facility] in about 2 months. Thanks for the consult.  Rosalin Hawking, MD PhD Stroke Neurology 08/17/2015 4:14 PM    To contact Stroke Continuity provider, please refer to [email or web address] After hours, contact General Neurology

## 2015-08-18 ENCOUNTER — Inpatient Hospital Stay (HOSPITAL_COMMUNITY): Payer: Medicare Other

## 2015-08-18 DIAGNOSIS — E038 Other specified hypothyroidism: Secondary | ICD-10-CM

## 2015-08-18 DIAGNOSIS — I6789 Other cerebrovascular disease: Secondary | ICD-10-CM

## 2015-08-18 LAB — ECHOCARDIOGRAM COMPLETE
Height: 66 in
Weight: 5121.6 oz

## 2015-08-18 LAB — GLUCOSE, CAPILLARY
GLUCOSE-CAPILLARY: 140 mg/dL — AB (ref 65–99)
GLUCOSE-CAPILLARY: 108 mg/dL — AB (ref 65–99)

## 2015-08-18 LAB — HEMOGLOBIN A1C
HEMOGLOBIN A1C: 6.2 % — AB (ref 4.8–5.6)
MEAN PLASMA GLUCOSE: 131 mg/dL

## 2015-08-18 MED ORDER — ATORVASTATIN CALCIUM 80 MG PO TABS: 80.0000 mg | ORAL_TABLET | Freq: Every day | ORAL | 0 refills | Status: DC

## 2015-08-18 MED ORDER — CLOPIDOGREL BISULFATE 75 MG PO TABS: 75.0000 mg | ORAL_TABLET | Freq: Every day | ORAL | 0 refills | Status: AC

## 2015-08-18 NOTE — Progress Notes (Signed)
PT Cancellation Note  Patient Details Name: Michelle Roth MRN: [id number] DOB: February 23, 1950   Cancelled Treatment:    Reason Eval/Treat Not Completed: Other (comment)  Pt indicates she has been getting up independently in the room and that the sensation has returned in her L LE.  Pt frustrated right now about continuing to wait for her Echo.  RN informed.  Will f/u if pt remains on acute.     Catarina Hartshorn, [employer] 08/18/2015, 10:48 AM

## 2015-08-18 NOTE — Progress Notes (Signed)
   08/18/15 1015  Clinical Encounter Type  Visited With Patient and family together  Visit Type Initial  Spiritual Encounters  Spiritual Needs Emotional  Chaplain on morning rounds visited with patient and offered emotional support.  Chaplain offered further support if needed.

## 2015-08-18 NOTE — Progress Notes (Signed)
Discharged to home via wheelchair via volunteer. All discharge instructions understood and given to pt. Pt had no questions about all her medications.

## 2015-08-18 NOTE — Progress Notes (Signed)
  Echocardiogram 2D Echocardiogram has been performed.  Michelle Roth 08/18/2015, 11:43 AM

## 2015-08-18 NOTE — Discharge Summary (Signed)
Physician Discharge Summary  Michelle Roth [id number] DOB: July 26, 1950 DOA: 08/16/2015  PCP: Glo Herring., MD  Admit date: 08/16/2015 Discharge date: 08/18/2015  Admitted From: home Disposition: home  Recommendations for Outpatient Follow-up:  1. Follow up with PCP in 1-2 weeks 2. Needs outpatient ENT follow up for incidental 15 mm left parotid mass 3. Echocardiogram pending results   Home Health: yes Equipment/Devices:no  Discharge Condition: stable CODE STATUS: full Diet recommendation: Heart Healthy  Brief/Interim Summary:  This is a 65 year old female who presents to hospital with the chief complaint of left-sided weakness. On her initial physical examination she was hemodynamically stable with a blood pressure 120/62, heart rate 53-64 with a respiratory rate of 12 and oxygen saturation 100%. Her mucous membranes were moist, her neck was supple, her lungs were clear to auscultation bilaterally, heart S1-S2 present rhythmic, her abdomen was soft nontender, neurologically patient had strength 5 out of 5 for all 4 extremities with cranial nerves 2 to 12 grossly intact. Brain MRI showed acute right thalamic infarct, mild to moderate chronic small vessel ischemic disease.  Patient was admitted to hospital working diagnosis acute right thalamic infarct.  1. Right thalamic infarct. Patient was placed on dual antiplatelet therapy with aspirin and Plavix, plus high-dose statin therapy, further workup with CT angiography showed focal moderate atheromatous type narrowing of the right P2 segment, notable given site of acute infarct. Atherosclerosis with proximal right ICA stenosis measuring 50-60% (possible or overestimation due to calcium blooming). MRI of the cervical spine showed C7 nerve inpatient with mild foraminal stenosis. Her LDL was 155. Neurology was consulted and recommended dual antiplatalet therapy for 3 months and then Plavix alone due to intracranial stenosis. Physical therapy  evaluation recommended home health follow-up. Echocardiogram is pending.  2. Hypertension. Patient's blood pressure systolic ranged between [id number] and 120, her antihypertensive agents were held to avoid hypotension. By the time of discharge she'll resume her bisoprolol and hctz.   3. Urinary tract infection. Patient was continued on cephalexin for antibiotic therapy, urinalysis from August 1 had 6-30 white cells.  4. Hypothyroidism. Continue levothyroxine.  5. Depression. She was continued on sertraline and trazodone.  6. Left parotid mass. Incidental finding of a 15 mm left parotid mass, recommendation for outpatient ENT evaluation.   Discharge Diagnoses:  Active Problems:   Thalamic infarct, acute (HCC)   Sleep apnea   Hypothyroidism   Essential hypertension   Depression   Anxiety   UTI (lower urinary tract infection)   Morbid obesity [facility])    Discharge Instructions  Discharge Instructions    Ambulatory referral to Neurology    Complete by:  As directed   Pt will follow up with Cecille Rubin NP at [facility] in about 2 months. Thanks.   Diet - low sodium heart healthy    Complete by:  As directed   Discharge instructions    Complete by:  As directed   Please follow up with primary care in 7 days.   Increase activity slowly    Complete by:  As directed       Medication List    TAKE these medications   ALPRAZolam 1 MG tablet Commonly known as:  XANAX Take 1 mg by mouth 4 (four) times daily.   aspirin EC 81 MG tablet Take 81 mg by mouth daily.   atorvastatin 80 MG tablet Commonly known as:  LIPITOR Take 1 tablet (80 mg total) by mouth daily at 6 PM.   bisoprolol-hydrochlorothiazide 2.5-6.25 MG tablet Commonly known  asDarrin Luis Take 1 tablet by mouth daily.   cephALEXin 500 MG capsule Commonly known as:  KEFLEX Take 1 capsule (500 mg total) by mouth 3 (three) times daily.   clopidogrel 75 MG tablet Commonly known as:  PLAVIX Take 1 tablet (75 mg total) by mouth  daily.   HYDROcodone-acetaminophen 10-325 MG tablet Commonly known as:  NORCO Take 1 tablet by mouth every 6 (six) hours as needed for moderate pain.   hydrOXYzine 25 MG capsule Commonly known as:  VISTARIL Take 25 mg by mouth every 6 (six) hours as needed for anxiety.   levothyroxine 150 MCG tablet Commonly known as:  SYNTHROID, LEVOTHROID Take 150 mcg by mouth daily.   sertraline 100 MG tablet Commonly known as:  ZOLOFT Take 50-100 mg by mouth daily. Take 50mg  every morning  Take 100mg  every night      Follow-up Information    Dennie Bible, NP. Schedule an appointment as soon as possible for a visit in 2 month(s).   Specialty:  Family Medicine Contact information: [address] [place] [address] [phone]        Glo Herring., MD Follow up in 1 week(s).   Specialty:  Internal Medicine Contact information: [address] [place] [id number] [phone]          Allergies  Allergen Reactions  . Biaxin [Clarithromycin]     Thrush   . Macrobid [Nitrofurantoin Monohyd Macro] Nausea Only    Consultations:  Neurology   Procedures/Studies: Ct Angio Head W Or Wo Contrast  Result Date: 08/17/2015 CLINICAL DATA:  Left-sided numbness.  Right thalamic infarct EXAM: CT ANGIOGRAPHY HEAD AND NECK TECHNIQUE: Multidetector CT imaging of the head and neck was performed using the standard protocol during bolus administration of intravenous contrast. Multiplanar CT image reconstructions and MIPs were obtained to evaluate the vascular anatomy. Carotid stenosis measurements (when applicable) are obtained utilizing NASCET criteria, using the distal internal carotid diameter as the denominator. CONTRAST:  50 cc Isovue 370 intravenous COMPARISON:  Brain MRI from yesterday FINDINGS: CT HEAD Brain: Known right thalamic lacunar infarct without hemorrhage. No evidence of new/interval infarct. Mild chronic white matter disease. Calvarium and skull  base: Negative Paranasal sinuses: Clear Orbits: Bilateral cataract resection. 15 mm left parotid nodule. CTA NECK Aortic arch: Atheromatous calcification.  Three vessel branching. Right carotid system: Atherosclerotic calcification greatest about the bifurcation with proximal ICA 50-60% stenosis, overestimated due to blooming. No evidence of dissection or plaque ulceration. There are two mid ICA kinks with moderate narrowing st the proximal tortuosity. Left carotid system: Comparatively small left carotid circulation due to aplastic left A1 segment. Atherosclerotic calcification mainly about the bifurcation without stenosis. No dissection or plaque ulceration. Vertebral arteries:No proximal subclavian artery stenosis. Strong left vertebral artery dominance. Left vertebral artery is smooth and widely patent. Tiny right vertebral artery which matches its vertebral foramen. The vessel is intermittently nonvisualized or poorly visualized - it is not seen beyond the dura. Skeleton: Multilevel cervical disc and facet degeneration. Recent cervical spine MRI. 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 Other neck: 15 mm left parotid nodule. Upper chest: Clear apical lungs. CTA HEAD Anterior circulation: Small left ICA in the setting of aplastic left A1 segment. Carotid siphon atherosclerosis without stenosis. No major branch occlusion, flow limiting stenosis, or beading. Negative for aneurysm. Posterior circulation: Strong left dominance. There is flow into the right PICA from the left. Otherwise, unremarkable vertebrobasilar branching. There is  moderate atherosclerotic type narrowing of the right P2 segment. No major branch occlusion. Negative for aneurysm. Venous sinuses: Patent Anatomic variants: Aplastic left A1 segment Delayed phase: No parenchymal enhancement or mass. IMPRESSION: 1. Congenitally hypoplastic right vertebral artery is intermittently poorly visualized. This may  be related to artifact superimposed on small vessel size, but dissection is also possible if there is right neck pain. The vessels is not seen beyond the dura; the right PICA via the left vertebral artery. 2. Focal moderate atheromatous type narrowing of the right P2 segment, notable given site of acute infarct. 3. Atherosclerosis. Proximal right ICA stenosis measures 50-60%, but is overestimated due to calcium blooming. Right mid ICA is narrowed by kinking. 4. Acute right thalamus infarct.  No hemorrhage or enlargement. 5. 15 mm left parotid mass.  Recommend outpatient ENT referral. Electronically Signed   By: Monte Fantasia M.D.   On: 08/17/2015 12:49   X-ray Chest Pa And Lateral  Result Date: 08/16/2015 CLINICAL DATA:  65 year old female with thalamic infarct. Left side numbness. Initial encounter. EXAM: CHEST  2 VIEW COMPARISON:  Chest radiographs 05/10/2005. FINDINGS: Mediastinal contours are stable since 2007, including mild prominence of the left hilum which is benign considering the stability. Other mediastinal contours are within normal limits. Visualized tracheal air column is within normal limits. No pneumothorax, pulmonary edema, pleural effusion or confluent pulmonary opacity. Stable mild increased interstitial markings. No acute osseous abnormality identified. IMPRESSION: No acute cardiopulmonary abnormality. Electronically Signed   By: Genevie Ann M.D.   On: 08/16/2015 17:14   Ct Head Wo Contrast  Result Date: 08/15/2015 CLINICAL DATA:  numbness in left arm and leg since yesterday. Denies confusion/dizziness/slurred speech/trouble speaking/trouble swallowing. History of HTN. EXAM: CT HEAD WITHOUT CONTRAST TECHNIQUE: Contiguous axial images were obtained from the base of the skull through the vertex without intravenous contrast. COMPARISON:  None. FINDINGS: There is minimal periventricular white matter change consistent with small vessel disease. There is no intra or extra-axial fluid collection or  mass lesion. The basilar cisterns and ventricles have a normal appearance. There is no CT evidence for acute infarction or hemorrhage. Bone windows show no acute calvarial injury. The paranasal and mastoid air cells are normally aerated. IMPRESSION: 1.  No evidence for acute intracranial abnormality. 2. Mild changes of small vessel disease. Electronically Signed   By: Nolon Nations M.D.   On: 08/15/2015 16:14   Ct Angio Neck W Or Wo Contrast  Result Date: 08/17/2015 CLINICAL DATA:  Left-sided numbness.  Right thalamic infarct EXAM: CT ANGIOGRAPHY HEAD AND NECK TECHNIQUE: Multidetector CT imaging of the head and neck was performed using the standard protocol during bolus administration of intravenous contrast. Multiplanar CT image reconstructions and MIPs were obtained to evaluate the vascular anatomy. Carotid stenosis measurements (when applicable) are obtained utilizing NASCET criteria, using the distal internal carotid diameter as the denominator. CONTRAST:  50 cc Isovue 370 intravenous COMPARISON:  Brain MRI from yesterday FINDINGS: CT HEAD Brain: Known right thalamic lacunar infarct without hemorrhage. No evidence of new/interval infarct. Mild chronic white matter disease. Calvarium and skull base: Negative Paranasal sinuses: Clear Orbits: Bilateral cataract resection. 15 mm left parotid nodule. CTA NECK Aortic arch: Atheromatous calcification.  Three vessel branching. Right carotid system: Atherosclerotic calcification greatest about the bifurcation with proximal ICA 50-60% stenosis, overestimated due to blooming. No evidence of dissection or plaque ulceration. There are two mid ICA kinks with moderate narrowing st the proximal tortuosity. Left carotid system: Comparatively small left carotid circulation due to aplastic  left A1 segment. Atherosclerotic calcification mainly about the bifurcation without stenosis. No dissection or plaque ulceration. Vertebral arteries:No proximal subclavian artery stenosis.  Strong left vertebral artery dominance. Left vertebral artery is smooth and widely patent. Tiny right vertebral artery which matches its vertebral foramen. The vessel is intermittently nonvisualized or poorly visualized - it is not seen beyond the dura. Skeleton: Multilevel cervical disc and facet degeneration. Recent cervical spine MRI. 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 Other neck: 15 mm left parotid nodule. Upper chest: Clear apical lungs. CTA HEAD Anterior circulation: Small left ICA in the setting of aplastic left A1 segment. Carotid siphon atherosclerosis without stenosis. No major branch occlusion, flow limiting stenosis, or beading. Negative for aneurysm. Posterior circulation: Strong left dominance. There is flow into the right PICA from the left. Otherwise, unremarkable vertebrobasilar branching. There is moderate atherosclerotic type narrowing of the right P2 segment. No major branch occlusion. Negative for aneurysm. Venous sinuses: Patent Anatomic variants: Aplastic left A1 segment Delayed phase: No parenchymal enhancement or mass. IMPRESSION: 1. Congenitally hypoplastic right vertebral artery is intermittently poorly visualized. This may be related to artifact superimposed on small vessel size, but dissection is also possible if there is right neck pain. The vessels is not seen beyond the dura; the right PICA via the left vertebral artery. 2. Focal moderate atheromatous type narrowing of the right P2 segment, notable given site of acute infarct. 3. Atherosclerosis. Proximal right ICA stenosis measures 50-60%, but is overestimated due to calcium blooming. Right mid ICA is narrowed by kinking. 4. Acute right thalamus infarct.  No hemorrhage or enlargement. 5. 15 mm left parotid mass.  Recommend outpatient ENT referral. Electronically Signed   By: Monte Fantasia M.D.   On: 08/17/2015 12:49   Mr Brain Wo Contrast  Result Date: 08/16/2015 CLINICAL DATA:   Left facial droop, left arm and leg numbness, and numbness in both hands beginning 2 days ago. EXAM: MRI HEAD WITHOUT CONTRAST TECHNIQUE: Multiplanar, multiecho pulse sequences of the brain and surrounding structures were obtained without intravenous contrast. COMPARISON:  Head CT 08/15/2015 FINDINGS: There is a 1 cm acute infarct in the right thalamus. There is no evidence of intracranial hemorrhage, mass, midline shift, or extra-axial fluid collection. Ventricles and sulci are normal for age. Patchy T2 hyperintensities in the cerebral white matter bilaterally are nonspecific but compatible with mild-to-moderate chronic small vessel ischemic disease. There is a chronic lacunar infarct in the left centrum semiovale. Prior bilateral cataract extraction is noted. Paranasal sinuses and mastoid air cells are clear. Major intracranial vascular flow voids are preserved. No focal osseous lesion. IMPRESSION: 1. Acute right thalamic infarct. 2. Mild-to-moderate chronic small vessel ischemic disease. Electronically Signed   By: Logan Bores M.D.   On: 08/16/2015 14:11   Mr Cervical Spine Wo Contrast  Result Date: 08/16/2015 CLINICAL DATA:  Left arm and leg numbness. Left-sided neck pain with numbness in both hands. EXAM: MRI CERVICAL SPINE WITHOUT CONTRAST TECHNIQUE: Multiplanar, multisequence MR imaging of the cervical spine was performed. No intravenous contrast was administered. COMPARISON:  None. FINDINGS: The study is motion degraded throughout, mildly to moderately so on the axial sequences which limits accurate characterization of neural foraminal stenosis. Alignment: Normal. Vertebrae: Preserved vertebral body heights without evidence of fracture. Moderate disc space narrowing at C6-7 with mild type 2 endplate changes. No significant vertebral marrow edema or suspicious osseous lesion identified. Cord: Normal signal and morphology. Posterior Fossa, vertebral arteries, paraspinal tissues: Hypoplastic  right  vertebral artery. Disc levels: C2-3:  Moderate right facet arthrosis without stenosis. C3-4: Left uncovertebral spurring and moderate left facet arthrosis result in moderate left neural foraminal stenosis without spinal stenosis. C4-5: Moderate left facet arthrosis results in at most mild left neural foraminal stenosis. No spinal stenosis. C5-6: Mild disc bulging asymmetric to the left, left uncovertebral spurring, and mild facet arthrosis result in mild left neural foraminal stenosis without spinal stenosis. C6-7: Disc bulging and moderate size left foraminal disc protrusion result in moderate to severe left neural foraminal stenosis with likely left C7 nerve impingement. No spinal stenosis. C7-T1:  Negative. IMPRESSION: 1. 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 2. Mild-to-moderate left-sided neural foraminal stenosis elsewhere as above. Electronically Signed   By: Logan Bores M.D.   On: 08/16/2015 14:22       Subjective: Patient is feeling better, her weakness has improved, denies any nausea, vomiting. No dyspnea or chest pain. Has been out of bed.  Discharge Exam: Vitals:   08/18/15 0123 08/18/15 0509  BP: 113/63 (!) 104/58  Pulse: (!) 57 61  Resp: 20 20  Temp: 97.7 F (36.5 C) 97.8 F (36.6 C)   Vitals:   08/17/15 1801 08/17/15 2122 08/18/15 0123 08/18/15 0509  BP: (!) 130/55 104/71 113/63 (!) 104/58  Pulse: 60 (!) 52 (!) 57 61  Resp: (!) 21 20 20 20   Temp: 97.7 F (36.5 C) 97.7 F (36.5 C) 97.7 F (36.5 C) 97.8 F (36.6 C)  TempSrc: Oral Oral Oral Oral  SpO2: 98% 95% 98% 98%  Weight:      Height:        General: Pt is alert, awake, not in acute distress Cardiovascular: RRR, S1/S2 +, no rubs, no gallops Respiratory: CTA bilaterally, no wheezing, no rhonchi Abdominal: Soft, NT, ND, bowel sounds + Extremities: no edema, no cyanosis    The results of significant  diagnostics from this hospitalization (including imaging, microbiology, ancillary and laboratory) are listed below for reference.     Microbiology: No results found for this or any previous visit (from the past 240 hour(s)).   Labs: BNP (last 3 results) No results for input(s): BNP in the last 8760 hours. Basic Metabolic Panel:  Recent Labs Lab 08/15/15 1436 08/15/15 1450 08/17/15 0612  NA 136 141 138  K 3.6 3.8 4.0  CL 104 100* 103  CO2 28  --  30  GLUCOSE 152* 152* 130*  BUN 16 16 13   CREATININE 0.88 0.90 0.97  CALCIUM 9.8  --  9.3   Liver Function Tests:  Recent Labs Lab 08/15/15 1436 08/17/15 0612  AST 45* 33  ALT 33 25  ALKPHOS 56 42  BILITOT 0.9 0.7  PROT 8.5* 6.2*  ALBUMIN 4.0 3.1*   No results for input(s): LIPASE, AMYLASE in the last 168 hours. No results for input(s): AMMONIA in the last 168 hours. CBC:  Recent Labs Lab 08/15/15 1436 08/15/15 1450 08/17/15 0612  WBC 7.3  --  5.4  NEUTROABS 5.7  --   --   HGB 14.2 15.6* 12.0  HCT 43.5 46.0 38.2  MCV 90.6  --  92.5  PLT 201  --  164   Cardiac Enzymes: No results for input(s): CKTOTAL, CKMB, CKMBINDEX, TROPONINI in the last 168 hours. BNP: Invalid input(s): POCBNP CBG:  Recent Labs Lab 08/17/15 0630 08/17/15 1153 08/17/15 1625 08/17/15 2120 08/18/15 0633  GLUCAP 123* 120*  110* 90 140*   D-Dimer No results for input(s): DDIMER in the last 72 hours. Hgb A1c  Recent Labs  08/17/15 0612  HGBA1C 6.2*   Lipid Profile  Recent Labs  08/17/15 0612  CHOL 203*  HDL 25*  LDLCALC 155*  TRIG 114  CHOLHDL 8.1   Thyroid function studies No results for input(s): TSH, T4TOTAL, T3FREE, THYROIDAB in the last 72 hours.  Invalid input(s): FREET3 Anemia work up  Recent Labs  08/16/15 1638  VITAMINB12 156*   Urinalysis    Component Value Date/Time   COLORURINE YELLOW 08/15/2015 [place] 08/15/2015 1528   LABSPEC 1.020 08/15/2015 1528   PHURINE 5.5 08/15/2015  1528   GLUCOSEU NEGATIVE 08/15/2015 1528   HGBUR TRACE (A) 08/15/2015 [place] 08/15/2015 [place] 08/15/2015 [place] 08/15/2015 1528   NITRITE NEGATIVE 08/15/2015 1528   LEUKOCYTESUR MODERATE (A) 08/15/2015 1528   Sepsis Labs Invalid input(s): PROCALCITONIN,  WBC,  LACTICIDVEN Microbiology No results found for this or any previous visit (from the past 240 hour(s)).   Time coordinating discharge: 45 minutes  SIGNED:   Tawni Millers, MD  [facility] 08/18/2015, 8:42 AM Pager   If 7PM-7AM, please contact night-coverage [email or web address] Password [email or web address]

## 2015-08-18 NOTE — Care Management Note (Signed)
Case Management Note  Patient Details  Name: Michelle Roth MRN: [id number] Date of Birth: 11-04-1950  Subjective/Objective:                    Action/Plan: Pt discharging home with orders for [facility] services. CM met with the patient and her husband and provided them with a list of [place] agencies in the [place] area. She stated she had used [facility] in the past and wanted to use them again. Butch Penny with [facility] notified and accepted the referral. Bedside RN updated.   Expected Discharge Date:                  Expected Discharge Plan:  [place]  In-House Referral:     Discharge planning Services  CM Consult  Post Acute Care Choice:  Home Health Choice offered to:  Patient  DME Arranged:    DME Agency:     HH Arranged:  PT [place]:  [place]  Status of Service:  Completed, signed off  If discussed at [place] of Stay Meetings, dates discussed:    Additional Comments:  Pollie Friar, RN 08/18/2015, 12:46 PM

## 2015-08-21 DIAGNOSIS — N390 Urinary tract infection, site not specified: Secondary | ICD-10-CM | POA: Diagnosis not present

## 2015-08-21 DIAGNOSIS — Z8673 Personal history of transient ischemic attack (TIA), and cerebral infarction without residual deficits: Secondary | ICD-10-CM | POA: Diagnosis not present

## 2015-08-21 DIAGNOSIS — R269 Unspecified abnormalities of gait and mobility: Secondary | ICD-10-CM | POA: Diagnosis not present

## 2015-08-21 DIAGNOSIS — E039 Hypothyroidism, unspecified: Secondary | ICD-10-CM | POA: Diagnosis not present

## 2015-08-21 DIAGNOSIS — I10 Essential (primary) hypertension: Secondary | ICD-10-CM | POA: Diagnosis not present

## 2015-08-21 DIAGNOSIS — F329 Major depressive disorder, single episode, unspecified: Secondary | ICD-10-CM | POA: Diagnosis not present

## 2015-08-23 DIAGNOSIS — R269 Unspecified abnormalities of gait and mobility: Secondary | ICD-10-CM | POA: Diagnosis not present

## 2015-08-23 DIAGNOSIS — Z8673 Personal history of transient ischemic attack (TIA), and cerebral infarction without residual deficits: Secondary | ICD-10-CM | POA: Diagnosis not present

## 2015-08-23 DIAGNOSIS — N390 Urinary tract infection, site not specified: Secondary | ICD-10-CM | POA: Diagnosis not present

## 2015-08-23 DIAGNOSIS — F329 Major depressive disorder, single episode, unspecified: Secondary | ICD-10-CM | POA: Diagnosis not present

## 2015-08-23 DIAGNOSIS — I10 Essential (primary) hypertension: Secondary | ICD-10-CM | POA: Diagnosis not present

## 2015-08-23 DIAGNOSIS — E039 Hypothyroidism, unspecified: Secondary | ICD-10-CM | POA: Diagnosis not present

## 2015-08-24 DIAGNOSIS — E063 Autoimmune thyroiditis: Secondary | ICD-10-CM | POA: Diagnosis not present

## 2015-08-24 DIAGNOSIS — R5383 Other fatigue: Secondary | ICD-10-CM | POA: Diagnosis not present

## 2015-08-24 DIAGNOSIS — G4733 Obstructive sleep apnea (adult) (pediatric): Secondary | ICD-10-CM | POA: Diagnosis not present

## 2015-08-24 DIAGNOSIS — D649 Anemia, unspecified: Secondary | ICD-10-CM | POA: Diagnosis not present

## 2015-08-24 DIAGNOSIS — I638 Other cerebral infarction: Secondary | ICD-10-CM | POA: Diagnosis not present

## 2015-08-24 DIAGNOSIS — Z1389 Encounter for screening for other disorder: Secondary | ICD-10-CM | POA: Diagnosis not present

## 2015-08-24 DIAGNOSIS — I10 Essential (primary) hypertension: Secondary | ICD-10-CM | POA: Diagnosis not present

## 2015-08-29 DIAGNOSIS — I10 Essential (primary) hypertension: Secondary | ICD-10-CM | POA: Diagnosis not present

## 2015-08-29 DIAGNOSIS — R269 Unspecified abnormalities of gait and mobility: Secondary | ICD-10-CM | POA: Diagnosis not present

## 2015-08-29 DIAGNOSIS — E039 Hypothyroidism, unspecified: Secondary | ICD-10-CM | POA: Diagnosis not present

## 2015-08-29 DIAGNOSIS — N390 Urinary tract infection, site not specified: Secondary | ICD-10-CM | POA: Diagnosis not present

## 2015-08-29 DIAGNOSIS — Z8673 Personal history of transient ischemic attack (TIA), and cerebral infarction without residual deficits: Secondary | ICD-10-CM | POA: Diagnosis not present

## 2015-08-29 DIAGNOSIS — F329 Major depressive disorder, single episode, unspecified: Secondary | ICD-10-CM | POA: Diagnosis not present

## 2015-08-31 DIAGNOSIS — Z8673 Personal history of transient ischemic attack (TIA), and cerebral infarction without residual deficits: Secondary | ICD-10-CM | POA: Diagnosis not present

## 2015-08-31 DIAGNOSIS — R269 Unspecified abnormalities of gait and mobility: Secondary | ICD-10-CM | POA: Diagnosis not present

## 2015-08-31 DIAGNOSIS — E039 Hypothyroidism, unspecified: Secondary | ICD-10-CM | POA: Diagnosis not present

## 2015-08-31 DIAGNOSIS — N390 Urinary tract infection, site not specified: Secondary | ICD-10-CM | POA: Diagnosis not present

## 2015-08-31 DIAGNOSIS — F329 Major depressive disorder, single episode, unspecified: Secondary | ICD-10-CM | POA: Diagnosis not present

## 2015-08-31 DIAGNOSIS — I10 Essential (primary) hypertension: Secondary | ICD-10-CM | POA: Diagnosis not present

## 2015-09-05 DIAGNOSIS — E559 Vitamin D deficiency, unspecified: Secondary | ICD-10-CM | POA: Diagnosis not present

## 2015-09-05 DIAGNOSIS — Z719 Counseling, unspecified: Secondary | ICD-10-CM | POA: Diagnosis not present

## 2015-09-05 DIAGNOSIS — G4700 Insomnia, unspecified: Secondary | ICD-10-CM | POA: Diagnosis not present

## 2015-09-06 DIAGNOSIS — R269 Unspecified abnormalities of gait and mobility: Secondary | ICD-10-CM | POA: Diagnosis not present

## 2015-09-06 DIAGNOSIS — Z8673 Personal history of transient ischemic attack (TIA), and cerebral infarction without residual deficits: Secondary | ICD-10-CM | POA: Diagnosis not present

## 2015-09-06 DIAGNOSIS — F329 Major depressive disorder, single episode, unspecified: Secondary | ICD-10-CM | POA: Diagnosis not present

## 2015-09-06 DIAGNOSIS — E039 Hypothyroidism, unspecified: Secondary | ICD-10-CM | POA: Diagnosis not present

## 2015-09-06 DIAGNOSIS — G4730 Sleep apnea, unspecified: Secondary | ICD-10-CM | POA: Diagnosis not present

## 2015-09-06 DIAGNOSIS — I10 Essential (primary) hypertension: Secondary | ICD-10-CM | POA: Diagnosis not present

## 2015-09-06 DIAGNOSIS — N390 Urinary tract infection, site not specified: Secondary | ICD-10-CM | POA: Diagnosis not present

## 2015-09-07 DIAGNOSIS — G4730 Sleep apnea, unspecified: Secondary | ICD-10-CM | POA: Diagnosis not present

## 2015-10-10 DIAGNOSIS — Z23 Encounter for immunization: Secondary | ICD-10-CM | POA: Diagnosis not present

## 2015-10-10 DIAGNOSIS — E538 Deficiency of other specified B group vitamins: Secondary | ICD-10-CM | POA: Diagnosis not present

## 2015-10-17 ENCOUNTER — Ambulatory Visit (INDEPENDENT_AMBULATORY_CARE_PROVIDER_SITE_OTHER): Payer: Medicare Other | Admitting: Nurse Practitioner

## 2015-10-17 ENCOUNTER — Encounter: Payer: Self-pay | Admitting: Nurse Practitioner

## 2015-10-17 VITALS — BP 155/78 | HR 60 | Ht 66.0 in | Wt 313.2 lb

## 2015-10-17 DIAGNOSIS — I639 Cerebral infarction, unspecified: Secondary | ICD-10-CM | POA: Diagnosis not present

## 2015-10-17 DIAGNOSIS — I10 Essential (primary) hypertension: Secondary | ICD-10-CM | POA: Diagnosis not present

## 2015-10-17 DIAGNOSIS — E785 Hyperlipidemia, unspecified: Secondary | ICD-10-CM | POA: Diagnosis not present

## 2015-10-17 DIAGNOSIS — I6381 Other cerebral infarction due to occlusion or stenosis of small artery: Secondary | ICD-10-CM

## 2015-10-17 NOTE — Progress Notes (Signed)
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 She states that she suddenly became very anxious. She walked into the bathroom to urinate and realized that she had some numbness on the left arm and left leg. She states this only involved the outside of both arm and leg with normal sensation on the inside of the extremities. She denies any other symptoms, specifically no weakness, vision changes, double vision, vertigo, difficulty speaking, trouble swallowing, balance problems, or new gait abnormalities. She initially thought this was related to her chronic back problems. However, when symptoms persisted on the morning of 08/15/15, she called the nurse line and was told to go to the emergency department for evaluation. She went to [facility] and was toldthat she likely had suffered a small stroke. They were unable to perform MRI scan due to her body habitus and recommended that she be evaluated here in [facility]. She was presented with the option of being transferred to [facility] or being discharged to follow-up on her own. She chose to be discharged. She presents today for further evaluation. She states that she has a known history of hypertension and high cholesterol. She has been told that she is diabetic in the past but was never given medication for this. She wanted to try to see if she could bring her glucose down on her own with diet and weight loss. She takes aspirin 81 mg daily. Patient was not administered IV t-PA. She was admitted for further evaluation and treatment. 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 MR cervical spine likely C7 nerve impingement.  CTA of the head and neck focal R P2 narrowing R ICA 50-60% left parotid mass. 2-D echo 60-65% EF LDL 155. Hemoglobin A1c 6.2. She was placed on aspirin and Plavix.She was placed on Lipitor but discontinued this due to myalgias. She was then placed on pravastatin by her primary care and this too has been discontinued due to myalgias. Will refer her to the lipid clinic . She is also been placed on vitamin B-12 injections monthly for her fatigue and vitamin D. She returns for reevaluation. In the stroke clinic today without further stroke or TIA symptoms.   REVIEW OF SYSTEMS: Full 14 system review of systems performed and notable only for those listed, all others are neg:  Constitutional: Fatigue Cardiovascular: neg Ear/Nose/Throat: neg  Skin: neg Eyes: neg Respiratory: neg Gastroitestinal: neg  Hematology/Lymphatic: neg  Endocrine: Excessive thirst Musculoskeletal: Joint pain back pain aching muscles pain neck stiffness Allergy/Immunology: neg Neurological: neg Psychiatric: Depression and anxiety, panic disorder Sleep : neg   ALLERGIES: Allergies  Allergen Reactions  . Biaxin [Clarithromycin]     Thrush   . Macrobid [Nitrofurantoin Monohyd Macro] Nausea Only    HOME MEDICATIONS: Outpatient Medications Prior to Visit  Medication Sig Dispense Refill  . ALPRAZolam (XANAX) 1 MG tablet Take 1 mg by mouth 4 (four) times daily.    Marland Kitchen aspirin EC 81 MG tablet Take 81 mg by mouth daily.    . bisoprolol-hydrochlorothiazide (ZIAC) 2.5-6.25 MG per tablet Take 1 tablet by mouth daily.    . clopidogrel (PLAVIX) 75 MG tablet Take 1 tablet (75 mg total) by mouth daily.  30 tablet 0  . HYDROcodone-acetaminophen (NORCO) 10-325 MG per tablet Take 1 tablet by mouth every 6 (six) hours as needed for moderate pain.    . hydrOXYzine (VISTARIL) 25 MG capsule Take 25 mg by mouth every 6 (six) hours as needed for anxiety.    Marland Kitchen levothyroxine (SYNTHROID, LEVOTHROID) 150 MCG tablet Take 150 mcg by mouth daily.       . sertraline (ZOLOFT) 100 MG tablet Take 50-100 mg by mouth daily. Take 50mg  every morning  Take 100mg  every night    . atorvastatin (LIPITOR) 80 MG tablet Take 1 tablet (80 mg total) by mouth daily at 6 PM. (Patient not taking: Reported on 10/17/2015) 30 tablet 0  . cephALEXin (KEFLEX) 500 MG capsule Take 1 capsule (500 mg total) by mouth 3 (three) times daily. (Patient not taking: Reported on 10/17/2015) 15 capsule 0   No facility-administered medications prior to visit.     PAST MEDICAL HISTORY: Past Medical History:  Diagnosis Date  . Anxiety   . Depression   . Dysrhythmia   . Hypertension   . Hypothyroidism   . Obesity   . Prediabetes   . Shortness of breath dyspnea   . Sleep apnea   . Stroke ([place])   . Stroke ([place]) 2017  . UTI (lower urinary tract infection)     PAST SURGICAL HISTORY: Past Surgical History:  Procedure Laterality Date  . ABDOMINAL HYSTERECTOMY    . APPENDECTOMY    . BREAST SURGERY Left    Lumpectomy/ Benign  . CATARACT EXTRACTION W/PHACO Right 05/10/2014   Procedure: CATARACT EXTRACTION PHACO AND INTRAOCULAR LENS PLACEMENT (IOC);  Surgeon: Rutherford Guys, MD;  Location: [facility];  Service: Ophthalmology;  Laterality: Right;  CDE:6.25  . CATARACT EXTRACTION W/PHACO Left 06/14/2014   Procedure: CATARACT EXTRACTION PHACO AND INTRAOCULAR LENS PLACEMENT (IOC);  Surgeon: Rutherford Guys, MD;  Location: [facility];  Service: Ophthalmology;  Laterality: Left;  CDE:4.19  . CHOLECYSTECTOMY    . KNEE ARTHROPLASTY Right   . REPLACEMENT TOTAL KNEE Left   . TOTAL THYROIDECTOMY Bilateral   . URETHRAL CYST REMOVAL      FAMILY HISTORY: History reviewed. No pertinent family history.  SOCIAL HISTORY: Social History   Social History  . Marital status: Married    Spouse name: N/A  . Number of children: N/A  . Years of education: N/A   Occupational History  . Not on file.   Social History Main Topics  . Smoking status: Former Smoker    Packs/day: 0.25    Years: 16.00     Quit date: 05/05/1985  . Smokeless tobacco: Never Used  . Alcohol use No  . Drug use: No  . Sexual activity: Not on file   Other Topics Concern  . Not on file   Social History Narrative  . No narrative on file     PHYSICAL EXAM  Vitals:   10/17/15 1310  BP: (!) 155/78  Pulse: 60  Weight: (!) 313 lb 3.2 oz (142.1 kg)  Height: 5\' 6"  (1.676 m)   Body mass index is 50.55 kg/m.  Generalized: Well developed, Morbidly obese female in no acute distress  Head: normocephalic and atraumatic,. Oropharynx benign  Neck: Supple, no carotid bruits  Cardiac: Regular rate rhythm, no murmur  Musculoskeletal: No deformity   Neurological examination   Mentation: Alert oriented to time, place, history taking. Attention span and concentration appropriate. Recent and remote memory intact.  Follows all commands speech and language fluent.   Cranial nerve  II-XII: Fundoscopic exam reveals sharp disc margins.Pupils were equal round reactive to light extraocular movements were full, visual field were full on confrontational test. Facial sensation and strength were normal. hearing was intact to finger rubbing bilaterally. Uvula tongue midline. head turning and shoulder shrug were normal and symmetric.Tongue protrusion into cheek strength was normal. Motor: normal bulk and tone, full strength in the BUE, BLE, fine finger movements normal, no pronator drift. No focal weakness Sensory: normal and symmetric to light touch,  Coordination: finger-nose-finger, heel-to-shin bilaterally, no dysmetria Reflexes: 1+ upper lower and symmetric plantar responses were flexor bilaterally. Gait and Station: In wheelchair not ambulated   DIAGNOSTIC DATA (LABS, IMAGING, TESTING) - I reviewed patient records, labs, notes, testing and imaging myself where available.  Lab Results  Component Value Date   WBC 5.4 08/17/2015   HGB 12.0 08/17/2015   HCT 38.2 08/17/2015   MCV 92.5 08/17/2015   PLT 164 08/17/2015       Component Value Date/Time   NA 138 08/17/2015 0612   K 4.0 08/17/2015 0612   CL 103 08/17/2015 0612   CO2 30 08/17/2015 0612   GLUCOSE 130 (H) 08/17/2015 0612   BUN 13 08/17/2015 0612   CREATININE 0.97 08/17/2015 0612   CALCIUM 9.3 08/17/2015 0612   PROT 6.2 (L) 08/17/2015 0612   ALBUMIN 3.1 (L) 08/17/2015 0612   AST 33 08/17/2015 0612   ALT 25 08/17/2015 0612   ALKPHOS 42 08/17/2015 0612   BILITOT 0.7 08/17/2015 0612   GFRNONAA 59 (L) 08/17/2015 0612   GFRAA >60 08/17/2015 0612   Lab Results  Component Value Date   CHOL 203 (H) 08/17/2015   HDL 25 (L) 08/17/2015   LDLCALC 155 (H) 08/17/2015   TRIG 114 08/17/2015   CHOLHDL 8.1 08/17/2015   Lab Results  Component Value Date   HGBA1C 6.2 (H) 08/17/2015   Lab Results  Component Value Date   VITAMINB12 156 (L) 08/16/2015    ASSESSMENT AND PLAN  65 y.o. year old female  has a past medical history of Anxiety; Depression; Dysrhythmia; Hypertension; Hypothyroidism; Obesity; Prediabetes; Shortness of breath dyspnea; Sleep apnea; Stroke ([place]);  (2017); here for hospital follow-up from her recent stroke. The patient is a current patient of Dr.Xu  who is out of the office today . This note is sent to the work in doctor.     PLAN: Stressed the importance of management of risk factors to prevent further stroke Continue Plavix and aspirin for secondary stroke prevention stop aspirin November 4th and continue Plavix only Maintain strict control of hypertension with blood pressure goal below 130/90, today's reading 155/78  continue antihypertensive medications, Ziac Control of diabetes with hemoglobin A1c below 6.5 followed by primary care most recent hemoglobin A1c 6.2 Cholesterol with LDL cholesterol less than 70, followed by primary care,  most recent 155, need to follow up Dr. 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

## 2015-10-17 NOTE — Patient Instructions (Addendum)
Stressed the importance of management of risk factors to prevent further stroke Continue Plavix and aspirin for secondary stroke prevention stop aspirin November 4th and continue Plavix only Maintain strict control of hypertension with blood pressure goal below 130/90, today's reading 155/78  continue antihypertensive medications, Ziac Control of diabetes with hemoglobin A1c below 6.5 followed by primary care most recent hemoglobin A1c 6.2 Cholesterol with LDL cholesterol less than 70, followed by primary care,  most recent 155, need to follow up with PCP regarding cholesterol eat healthy diet with whole grains,  fresh fruits and vegetables,  Discussed risk for recurrent stroke/ TIA and answered additional questions Follow up with ENT regarding parotid mass Follow up 3 months

## 2015-11-16 ENCOUNTER — Telehealth: Payer: Self-pay | Admitting: Nurse Practitioner

## 2015-11-16 DIAGNOSIS — E538 Deficiency of other specified B group vitamins: Secondary | ICD-10-CM | POA: Diagnosis not present

## 2015-11-16 NOTE — Telephone Encounter (Signed)
Faxed last ofv note to Dr. Gerarda Fraction, pcp (as well as Dr. Erlinda Hong stroke note relating to IC stenosis and DAPT).  [phone].  [phone]. (confirmation received).

## 2015-11-16 NOTE — Progress Notes (Signed)
Personally have participated in and made any corrections needed to history, physical, neuro exam,assessment and plan as stated above.    Lancer Thurner, MD [facility] 

## 2015-11-16 NOTE — Telephone Encounter (Signed)
Patient called to advise, when she saw NP, Hoyle Sauer in October she was advised to discontinue 81 MG Aspirin, 1st of November. Saw PCP/Dr. Redmond School, [place] today and states Dr. Gerarda Fraction needs letter from NP, Hoyle Sauer stating why patient is to discontinue 81 MG Aspirin.

## 2015-11-21 ENCOUNTER — Encounter: Payer: Self-pay | Admitting: Cardiovascular Disease

## 2015-11-26 NOTE — Progress Notes (Deleted)
Cardiology Office Note   Date:  11/26/2015   ID:  Michelle Roth, DOB 1950-08-10, MRN [id number]  PCP:  Glo Herring., MD  Cardiologist:   Skeet Latch, MD   No chief complaint on file.     History of Present Illness: Michelle Roth is a 65 y.o. female with prior stroke, hypertension, hyperlipidemia, OSA and morbid obesity who presents for ***  Michelle Roth had a thalamic infarct with L sided weakness on 07/2015.  He stroke was due to small vessel disease.  In neurology follow up with Evlyn Courier, NP, it was noted that Michelle Roth LDL was 155.  She has previously been unable to tolerate pravastatin or atorvastatin.  She was referred to cardiology for consideration of a PCSK9 inhibitor.     Past Medical History:  Diagnosis Date  . Anxiety   . Depression   . Dysrhythmia   . Hypertension   . Hypothyroidism   . Obesity   . Prediabetes   . Shortness of breath dyspnea   . Sleep apnea   . Stroke ([place])   . Stroke ([place]) 2017  . UTI (lower urinary tract infection)     Past Surgical History:  Procedure Laterality Date  . ABDOMINAL HYSTERECTOMY    . APPENDECTOMY    . BREAST SURGERY Left    Lumpectomy/ Benign  . CATARACT EXTRACTION W/PHACO Right 05/10/2014   Procedure: CATARACT EXTRACTION PHACO AND INTRAOCULAR LENS PLACEMENT (IOC);  Surgeon: Rutherford Guys, MD;  Location: [facility];  Service: Ophthalmology;  Laterality: Right;  CDE:6.25  . CATARACT EXTRACTION W/PHACO Left 06/14/2014   Procedure: CATARACT EXTRACTION PHACO AND INTRAOCULAR LENS PLACEMENT (IOC);  Surgeon: Rutherford Guys, MD;  Location: [facility];  Service: Ophthalmology;  Laterality: Left;  CDE:4.19  . CHOLECYSTECTOMY    . KNEE ARTHROPLASTY Right   . REPLACEMENT TOTAL KNEE Left   . TOTAL THYROIDECTOMY Bilateral   . URETHRAL CYST REMOVAL       Current Outpatient Prescriptions  Medication Sig Dispense Refill  . ALPRAZolam (XANAX) 1 MG tablet Take 1 mg by mouth 4 (four) times daily.    Marland Kitchen aspirin EC 81 MG tablet Take 81 mg  by mouth daily.    . bisoprolol-hydrochlorothiazide (ZIAC) 2.5-6.25 MG per tablet Take 1 tablet by mouth daily.    . clopidogrel (PLAVIX) 75 MG tablet Take 1 tablet (75 mg total) by mouth daily. 30 tablet 0  . HYDROcodone-acetaminophen (NORCO) 10-325 MG per tablet Take 1 tablet by mouth every 6 (six) hours as needed for moderate pain.    . hydrOXYzine (VISTARIL) 25 MG capsule Take 25 mg by mouth every 6 (six) hours as needed for anxiety.    Marland Kitchen levothyroxine (SYNTHROID, LEVOTHROID) 150 MCG tablet Take 150 mcg by mouth daily.     . sertraline (ZOLOFT) 100 MG tablet Take 50-100 mg by mouth daily. Take 50mg  every morning  Take 100mg  every night     No current facility-administered medications for this visit.     Allergies:   Biaxin [clarithromycin] and Macrobid [nitrofurantoin monohyd macro]    Social History:  The patient  reports that she quit smoking about 30 years ago. She has a 4.00 pack-year smoking history. She has never used smokeless tobacco. She reports that she does not drink alcohol or use drugs.   Family History:  The patient's ***family history is not on file.    ROS:  Please see the history of present illness.   Otherwise, review of systems are positive for {NONE  DEFAULTED:18576::"none"}.   All other systems are reviewed and negative.    PHYSICAL EXAM: VS:  There were no vitals taken for this visit. , BMI There is no height or weight on file to calculate BMI. GENERAL:  Well appearing HEENT:  Pupils equal round and reactive, fundi not visualized, oral mucosa unremarkable NECK:  No jugular venous distention, waveform within normal limits, carotid upstroke brisk and symmetric, no bruits, no thyromegaly LYMPHATICS:  No cervical adenopathy LUNGS:  Clear to auscultation bilaterally HEART:  RRR.  PMI not displaced or sustained,S1 and S2 within normal limits, no S3, no S4, no clicks, no rubs, *** murmurs ABD:  Flat, positive bowel sounds normal in frequency in pitch, no bruits, no  rebound, no guarding, no midline pulsatile mass, no hepatomegaly, no splenomegaly EXT:  2 plus pulses throughout, no edema, no cyanosis no clubbing SKIN:  No rashes no nodules NEURO:  Cranial nerves II through XII grossly intact, motor grossly intact throughout PSYCH:  Cognitively intact, oriented to person place and time    EKG:  EKG {ACTION; IS/IS GI:[id number] ordered today. The ekg ordered today demonstrates ***   Recent Labs: 08/17/2015: ALT 25; BUN 13; Creatinine, Ser 0.97; Hemoglobin 12.0; Platelets 164; Potassium 4.0; Sodium 138    Lipid Panel    Component Value Date/Time   CHOL 203 (H) 08/17/2015 0612   TRIG 114 08/17/2015 0612   HDL 25 (L) 08/17/2015 0612   CHOLHDL 8.1 08/17/2015 0612   VLDL 23 08/17/2015 0612   LDLCALC 155 (H) 08/17/2015 0612      Wt Readings from Last 3 Encounters:  10/17/15 (!) 142.1 kg (313 lb 3.2 oz)  08/16/15 (!) 145.2 kg (320 lb 1.6 oz)  08/15/15 (!) 143.8 kg (317 lb)      ASSESSMENT AND PLAN:  ***   Current medicines are reviewed at length with the patient today.  The patient {ACTIONS; HAS/DOES NOT HAVE:19233} concerns regarding medicines.  The following changes have been made:  {PLAN; NO CHANGE:13088:s}  Labs/ tests ordered today include: *** No orders of the defined types were placed in this encounter.    Disposition:   FU with ***    This note was written with the assistance of speech recognition software.  Please excuse any transcriptional errors.  Signed, Zackarey Holleman C. Oval Linsey, MD, [facility]  11/26/2015 5:42 AM    [place]

## 2015-11-27 ENCOUNTER — Encounter: Payer: Self-pay | Admitting: *Deleted

## 2015-11-27 ENCOUNTER — Ambulatory Visit: Payer: Medicare Other | Admitting: Cardiovascular Disease

## 2015-11-29 DIAGNOSIS — E119 Type 2 diabetes mellitus without complications: Secondary | ICD-10-CM | POA: Diagnosis not present

## 2015-11-29 DIAGNOSIS — I10 Essential (primary) hypertension: Secondary | ICD-10-CM | POA: Diagnosis not present

## 2015-11-29 DIAGNOSIS — Z0000 Encounter for general adult medical examination without abnormal findings: Secondary | ICD-10-CM | POA: Diagnosis not present

## 2015-11-29 DIAGNOSIS — G894 Chronic pain syndrome: Secondary | ICD-10-CM | POA: Diagnosis not present

## 2015-12-11 DIAGNOSIS — E119 Type 2 diabetes mellitus without complications: Secondary | ICD-10-CM | POA: Diagnosis not present

## 2015-12-26 DIAGNOSIS — D510 Vitamin B12 deficiency anemia due to intrinsic factor deficiency: Secondary | ICD-10-CM | POA: Diagnosis not present

## 2016-01-17 ENCOUNTER — Ambulatory Visit: Payer: Medicare Other | Admitting: Nurse Practitioner

## 2016-03-15 DIAGNOSIS — R0602 Shortness of breath: Secondary | ICD-10-CM | POA: Diagnosis not present

## 2016-03-15 DIAGNOSIS — R109 Unspecified abdominal pain: Secondary | ICD-10-CM | POA: Diagnosis not present

## 2016-03-15 DIAGNOSIS — R509 Fever, unspecified: Secondary | ICD-10-CM | POA: Diagnosis not present

## 2016-03-15 DIAGNOSIS — Z0131 Encounter for examination of blood pressure with abnormal findings: Secondary | ICD-10-CM | POA: Diagnosis not present

## 2016-03-15 DIAGNOSIS — R05 Cough: Secondary | ICD-10-CM | POA: Diagnosis not present

## 2016-03-26 DIAGNOSIS — J209 Acute bronchitis, unspecified: Secondary | ICD-10-CM | POA: Diagnosis not present

## 2016-03-26 DIAGNOSIS — R05 Cough: Secondary | ICD-10-CM | POA: Diagnosis not present

## 2016-07-03 DIAGNOSIS — I639 Cerebral infarction, unspecified: Secondary | ICD-10-CM | POA: Diagnosis not present

## 2016-07-03 DIAGNOSIS — E782 Mixed hyperlipidemia: Secondary | ICD-10-CM | POA: Diagnosis not present

## 2016-07-03 DIAGNOSIS — G894 Chronic pain syndrome: Secondary | ICD-10-CM | POA: Diagnosis not present

## 2016-07-03 DIAGNOSIS — E119 Type 2 diabetes mellitus without complications: Secondary | ICD-10-CM | POA: Diagnosis not present

## 2016-07-03 DIAGNOSIS — Z1389 Encounter for screening for other disorder: Secondary | ICD-10-CM | POA: Diagnosis not present

## 2016-11-07 DIAGNOSIS — I10 Essential (primary) hypertension: Secondary | ICD-10-CM | POA: Diagnosis not present

## 2016-11-07 DIAGNOSIS — H6991 Unspecified Eustachian tube disorder, right ear: Secondary | ICD-10-CM | POA: Diagnosis not present

## 2016-11-07 DIAGNOSIS — Z23 Encounter for immunization: Secondary | ICD-10-CM | POA: Diagnosis not present

## 2016-11-07 DIAGNOSIS — Z1389 Encounter for screening for other disorder: Secondary | ICD-10-CM | POA: Diagnosis not present

## 2016-12-02 DIAGNOSIS — E559 Vitamin D deficiency, unspecified: Secondary | ICD-10-CM | POA: Diagnosis not present

## 2016-12-02 DIAGNOSIS — J4520 Mild intermittent asthma, uncomplicated: Secondary | ICD-10-CM | POA: Diagnosis not present

## 2016-12-02 DIAGNOSIS — Z1389 Encounter for screening for other disorder: Secondary | ICD-10-CM | POA: Diagnosis not present

## 2016-12-02 DIAGNOSIS — M797 Fibromyalgia: Secondary | ICD-10-CM | POA: Diagnosis not present

## 2016-12-02 DIAGNOSIS — E782 Mixed hyperlipidemia: Secondary | ICD-10-CM | POA: Diagnosis not present

## 2016-12-02 DIAGNOSIS — E538 Deficiency of other specified B group vitamins: Secondary | ICD-10-CM | POA: Diagnosis not present

## 2016-12-02 DIAGNOSIS — R300 Dysuria: Secondary | ICD-10-CM | POA: Diagnosis not present

## 2016-12-02 DIAGNOSIS — Z79891 Long term (current) use of opiate analgesic: Secondary | ICD-10-CM | POA: Diagnosis not present

## 2016-12-02 DIAGNOSIS — G479 Sleep disorder, unspecified: Secondary | ICD-10-CM | POA: Diagnosis not present

## 2016-12-02 DIAGNOSIS — Z0001 Encounter for general adult medical examination with abnormal findings: Secondary | ICD-10-CM | POA: Diagnosis not present

## 2016-12-03 DIAGNOSIS — E119 Type 2 diabetes mellitus without complications: Secondary | ICD-10-CM | POA: Diagnosis not present

## 2016-12-03 DIAGNOSIS — Z1231 Encounter for screening mammogram for malignant neoplasm of breast: Secondary | ICD-10-CM | POA: Diagnosis not present

## 2016-12-03 DIAGNOSIS — Z961 Presence of intraocular lens: Secondary | ICD-10-CM | POA: Diagnosis not present

## 2016-12-03 DIAGNOSIS — H524 Presbyopia: Secondary | ICD-10-CM | POA: Diagnosis not present

## 2016-12-19 DIAGNOSIS — E538 Deficiency of other specified B group vitamins: Secondary | ICD-10-CM | POA: Diagnosis not present

## 2017-01-30 DIAGNOSIS — E538 Deficiency of other specified B group vitamins: Secondary | ICD-10-CM | POA: Diagnosis not present

## 2017-01-30 DIAGNOSIS — E782 Mixed hyperlipidemia: Secondary | ICD-10-CM | POA: Diagnosis not present

## 2017-01-30 DIAGNOSIS — Z1389 Encounter for screening for other disorder: Secondary | ICD-10-CM | POA: Diagnosis not present

## 2017-01-30 DIAGNOSIS — Z23 Encounter for immunization: Secondary | ICD-10-CM | POA: Diagnosis not present

## 2017-01-30 DIAGNOSIS — E119 Type 2 diabetes mellitus without complications: Secondary | ICD-10-CM | POA: Diagnosis not present

## 2017-01-30 DIAGNOSIS — E039 Hypothyroidism, unspecified: Secondary | ICD-10-CM | POA: Diagnosis not present

## 2017-02-20 DIAGNOSIS — E538 Deficiency of other specified B group vitamins: Secondary | ICD-10-CM | POA: Diagnosis not present

## 2017-02-21 DIAGNOSIS — M1712 Unilateral primary osteoarthritis, left knee: Secondary | ICD-10-CM | POA: Diagnosis not present

## 2017-02-21 DIAGNOSIS — M1711 Unilateral primary osteoarthritis, right knee: Secondary | ICD-10-CM | POA: Diagnosis not present

## 2018-02-05 IMAGING — DX DG CHEST 2V
2 series · 2 of 2 positions shown · non-contrast
Comparison: Chest radiographs 05/10/2005.

CLINICAL DATA: 65-year-old female with thalamic infarct. Left side
numbness. Initial encounter.

EXAM:
CHEST  2 VIEW

[chest pa]
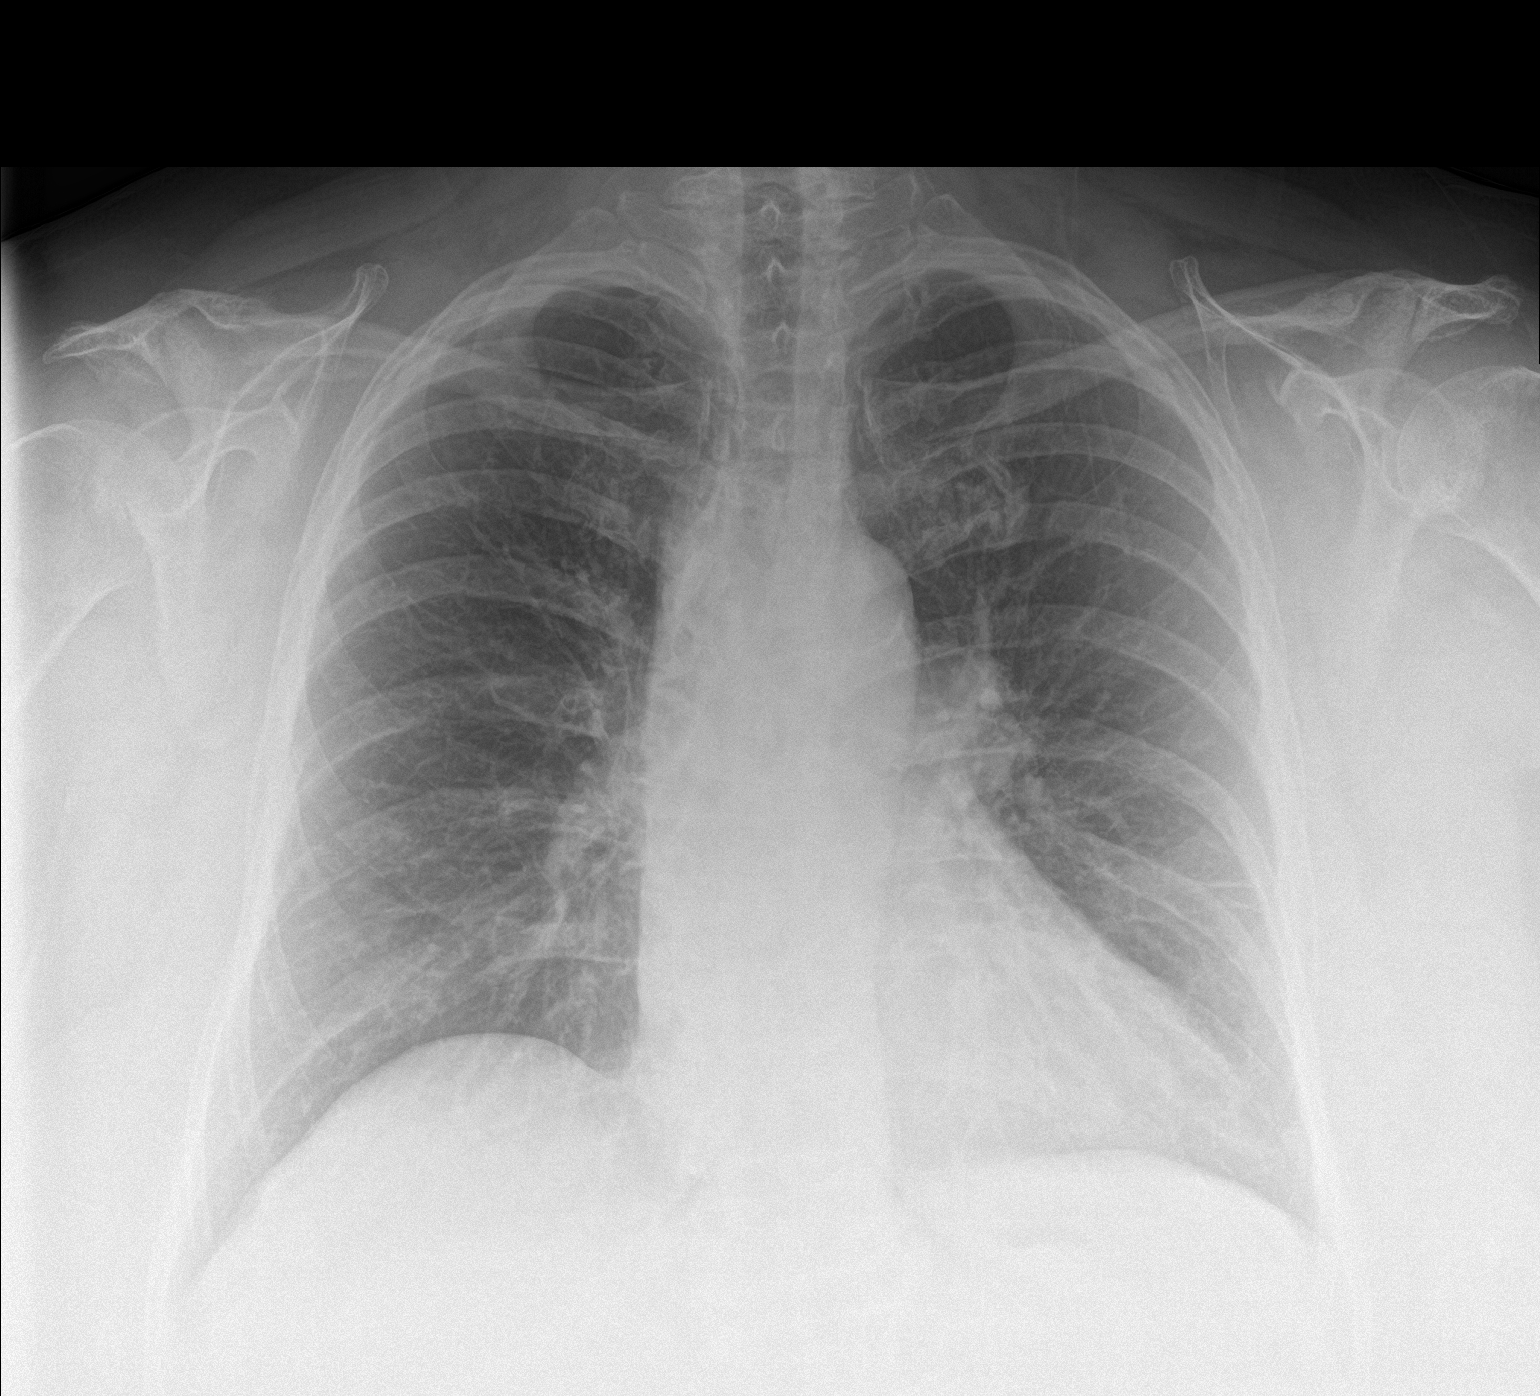

[chest lat]
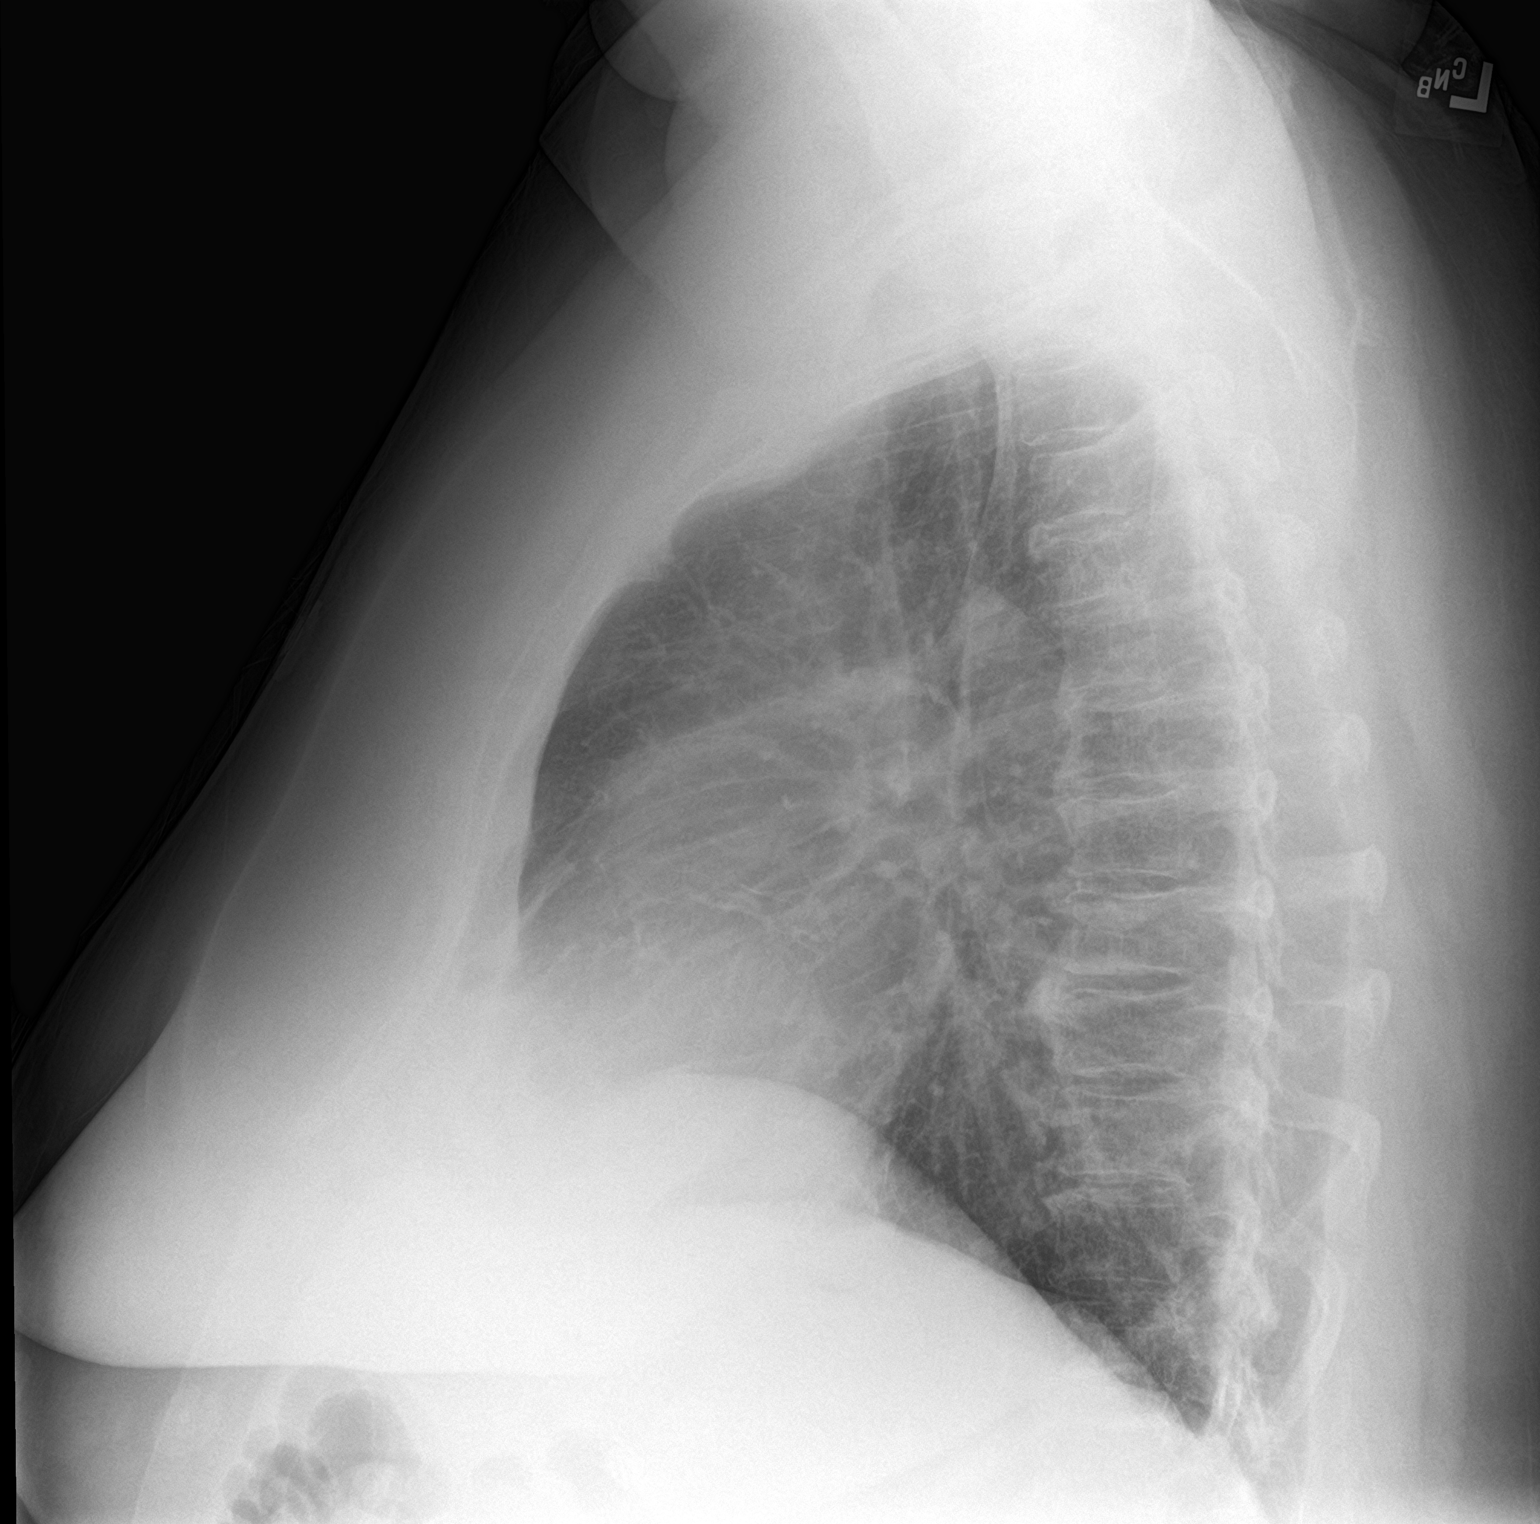

[2 of 2 positions shown; findings below may reference images not displayed]

FINDINGS: Mediastinal contours are stable since 1551, including mild
prominence of the left hilum which is benign considering the
stability. Other mediastinal contours are within normal limits.
Visualized tracheal air column is within normal limits. No
pneumothorax, pulmonary edema, pleural effusion or confluent
pulmonary opacity. Stable mild increased interstitial markings. No
acute osseous abnormality identified.
IMPRESSION: No acute cardiopulmonary abnormality.

## 2018-02-05 IMAGING — MR MR HEAD W/O CM
9 of 11 series · 33 of 48 positions shown · non-contrast
Comparison: Head CT 08/15/2015

CLINICAL DATA: Left facial droop, left arm and leg numbness, and
numbness in both hands beginning 2 days ago.

EXAM:
MRI HEAD WITHOUT CONTRAST
TECHNIQUE: Multiplanar, multiecho pulse sequences of the brain and surrounding
structures were obtained without intravenous contrast.

[Series 2: FLAIR · sagittal · 5.0mm · 0.47mm/px · 2 of 26 slices shown (1 of 2)]
[im 1/26]
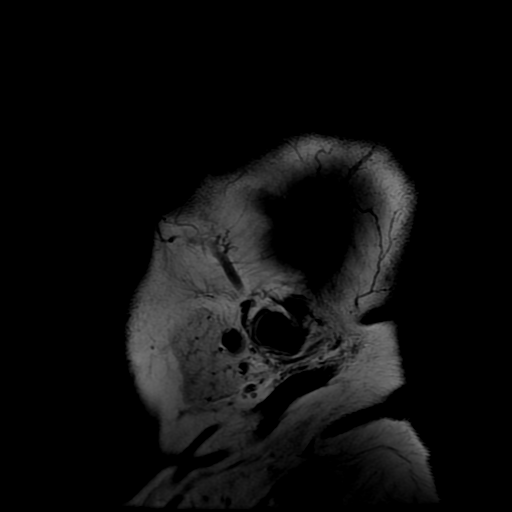
[im 26/26]
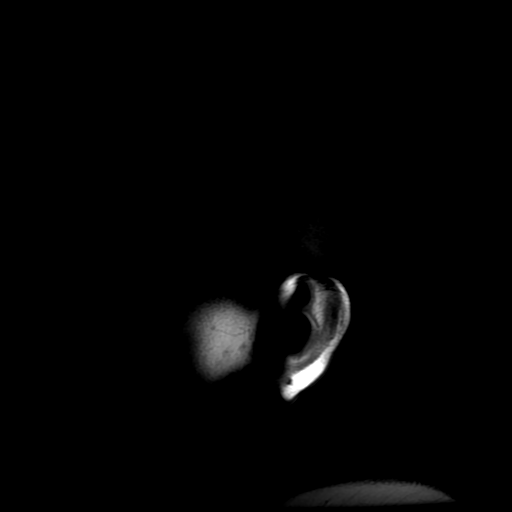

[Series 4: DWI · axial · 3.0mm · 0.94mm/px · z∈[-21,+123]mm · 8 of 99 slices shown (1 of 2)]
[im 1/99]
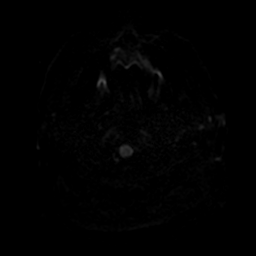
[im 15/99]
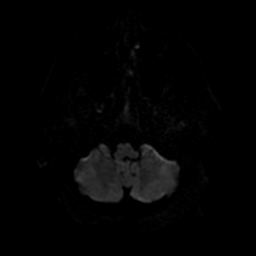
[im 29/99]
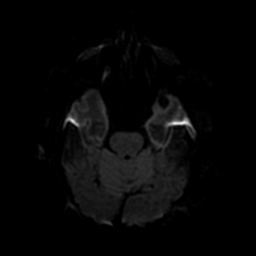
[im 43/99]
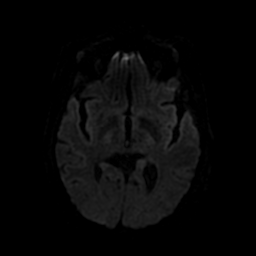
[im 57/99]
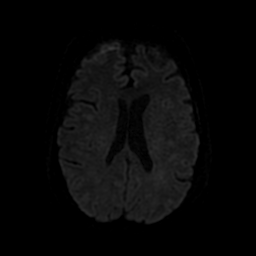
[im 71/99]
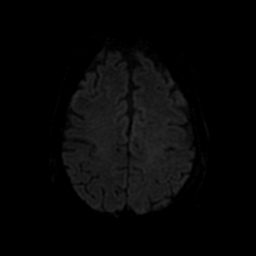
[im 85/99]
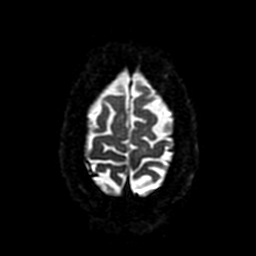
[im 99/99]
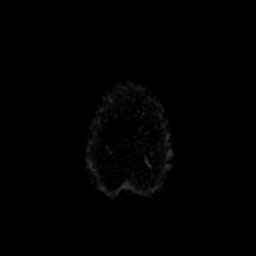

[Series 6: T2 · axial · 5.0mm · 0.47mm/px · z∈[-25,+122]mm · 2 of 26 slices shown (1 of 2)]
[im 1/26]
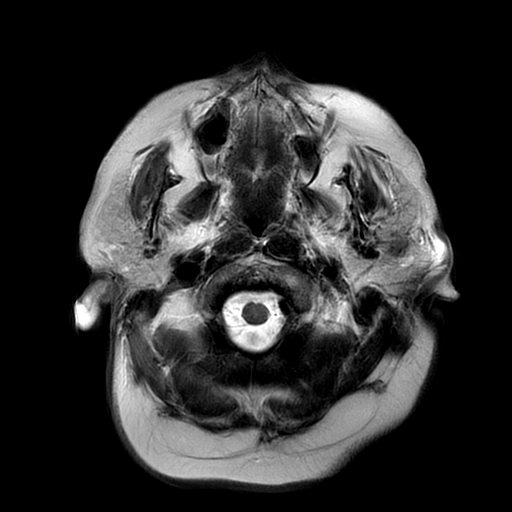
[im 26/26]
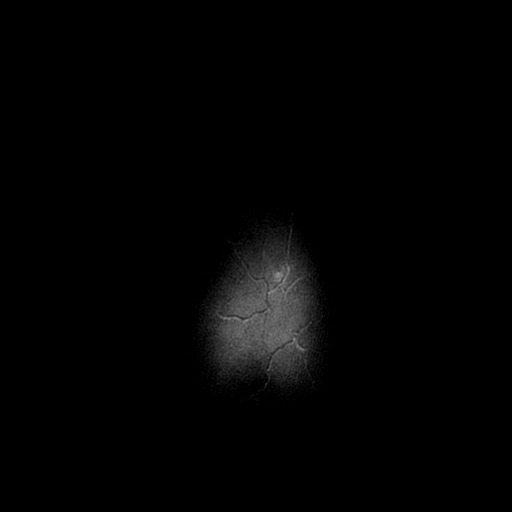

[Series 7: FLAIR · axial · 5.0mm · 0.47mm/px · z∈[-25,+122]mm · 2 of 26 slices shown (2 of 2)]
[im 1/26]
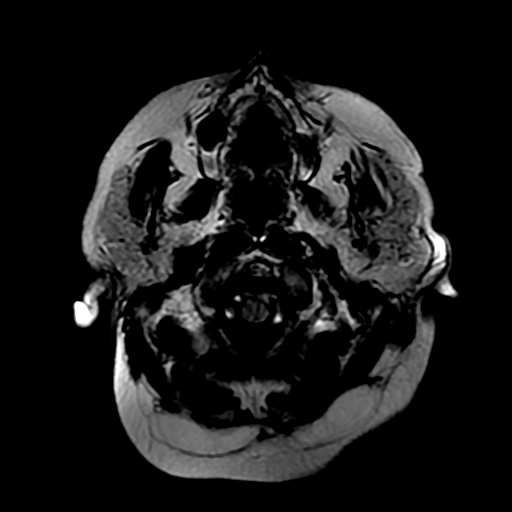
[im 26/26]
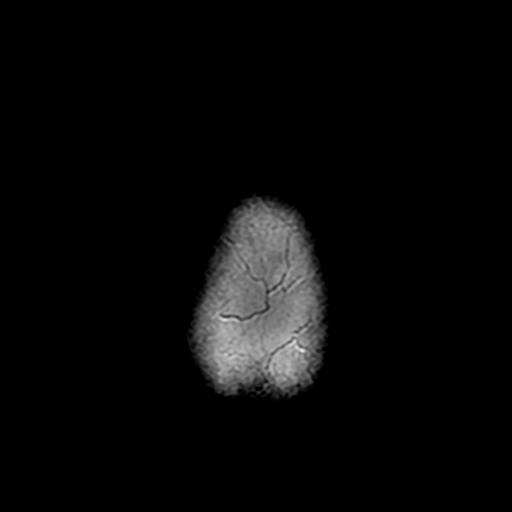

[Series 8: DWI · coronal · 4.0mm · 0.94mm/px · 5 of 70 slices shown (2 of 2)]
[im 1/70]
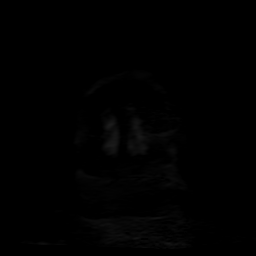
[im 18/70]
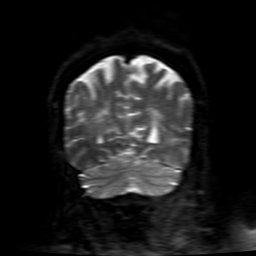
[im 35/70]
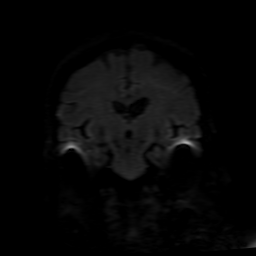
[im 52/70]
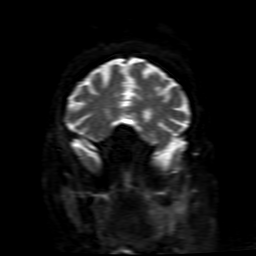
[im 70/70]
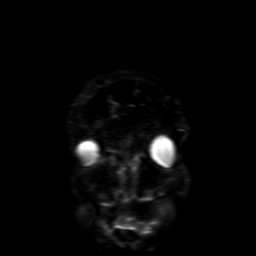

[Series 9: (person_name) · axial · 3.0mm · 0.47mm/px · z∈[-22,+60]mm · 5 of 100 slices shown]
[im 1/100]
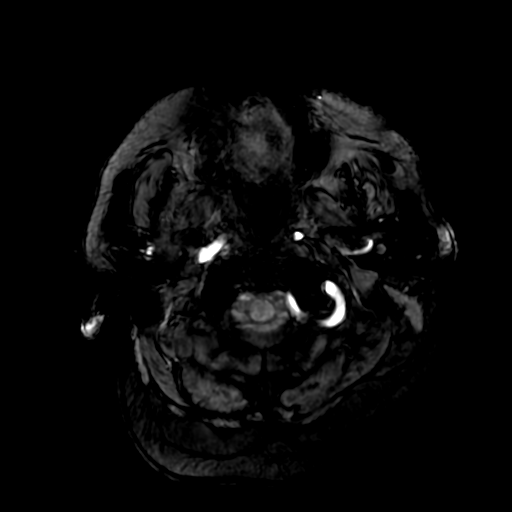
[im 15/100]
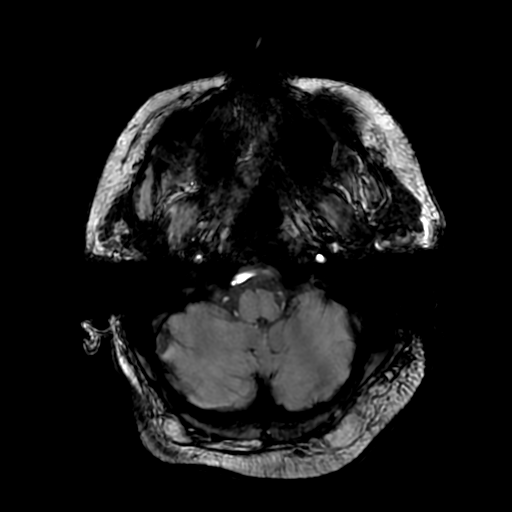
[im 29/100]
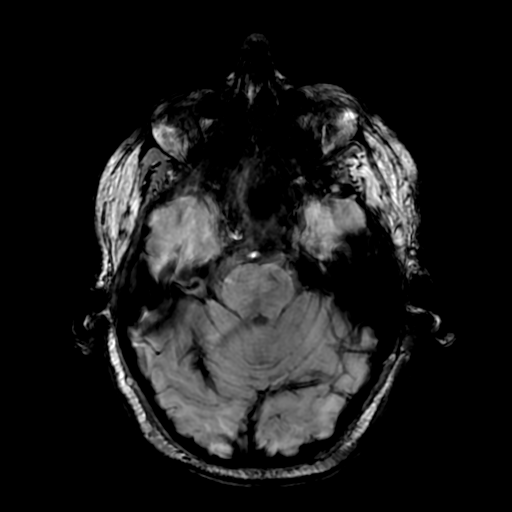
[im 43/100]
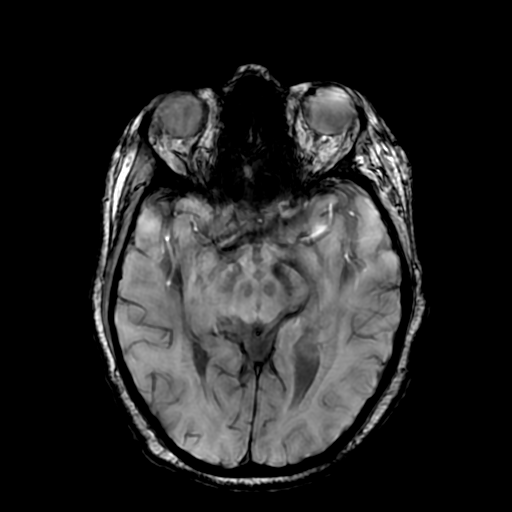
[im 57/100]
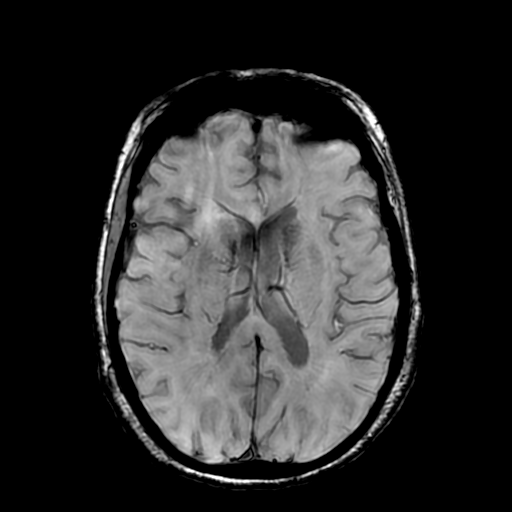

[Series 11: T2 · coronal · 5.0mm · 0.39mm/px · 2 of 30 slices shown (2 of 2)]
[im 1/30]
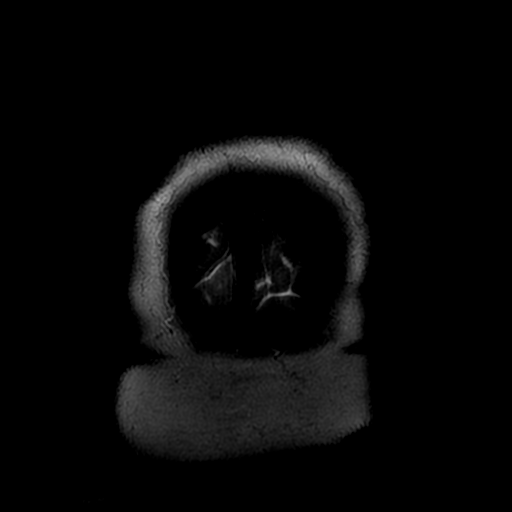
[im 30/30]
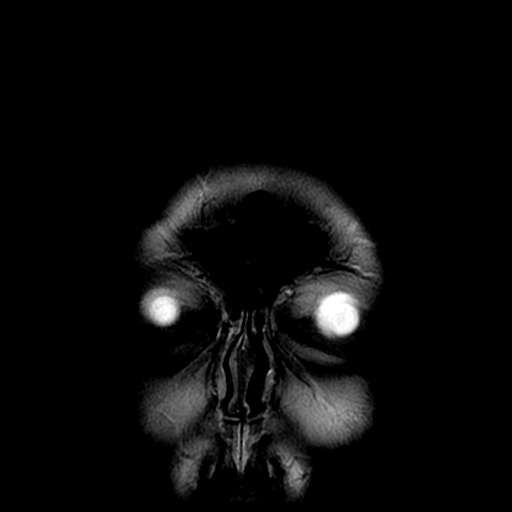

[Series 450: ADC · axial · 3.0mm · 0.94mm/px · z∈[-21,+123]mm · 4 of 50 slices shown (1 of 2)]
[im 1/50]
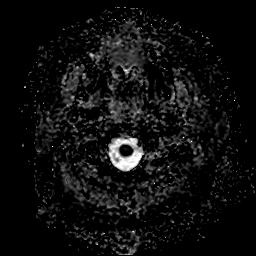
[im 17/50]
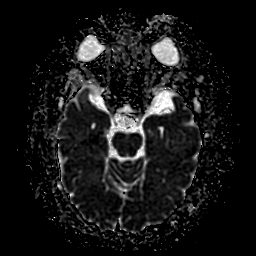
[im 33/50]
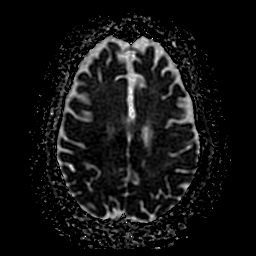
[im 50/50]
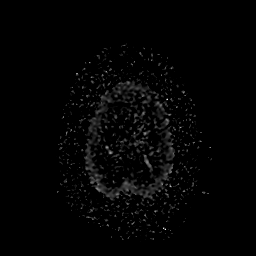

[Series 850: ADC · coronal · 4.0mm · 0.94mm/px · 3 of 35 slices shown (2 of 2)]
[im 1/35]
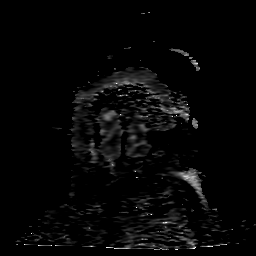
[im 18/35]
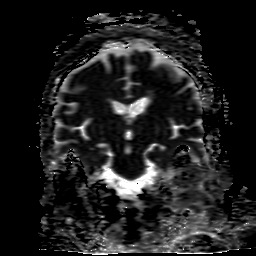
[im 35/35]
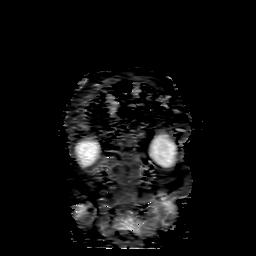

[33 of 48 positions shown; findings below may reference images not displayed]

FINDINGS: There is a 1 cm acute infarct in the right thalamus. There is no
evidence of intracranial hemorrhage, mass, midline shift, or
extra-axial fluid collection. Ventricles and sulci are normal for
age. Patchy T2 hyperintensities in the cerebral white matter
bilaterally are nonspecific but compatible with mild-to-moderate
chronic small vessel ischemic disease. There is a chronic lacunar
infarct in the left centrum semiovale.

Prior bilateral cataract extraction is noted. Paranasal sinuses and
mastoid air cells are clear. Major intracranial vascular flow voids
are preserved. No focal osseous lesion.
IMPRESSION: 1. Acute right thalamic infarct.
2. Mild-to-moderate chronic small vessel ischemic disease.

## 2018-02-05 IMAGING — MR MR CERVICAL SPINE W/O CM
4 of 5 series · 19 of 48 positions shown · non-contrast
Comparison: None.

CLINICAL DATA: Left arm and leg numbness. Left-sided neck pain with
numbness in both hands.

EXAM:
MRI CERVICAL SPINE WITHOUT CONTRAST
TECHNIQUE: Multiplanar, multisequence MR imaging of the cervical spine was
performed. No intravenous contrast was administered.

[Series 3: T2 · sagittal · 3.0mm · 0.43mm/px · 6 of 13 slices shown (1 of 2)]
[im 1/13]
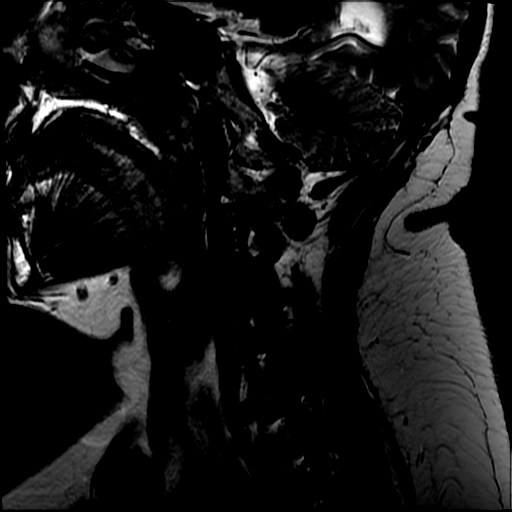
[im 3/13]
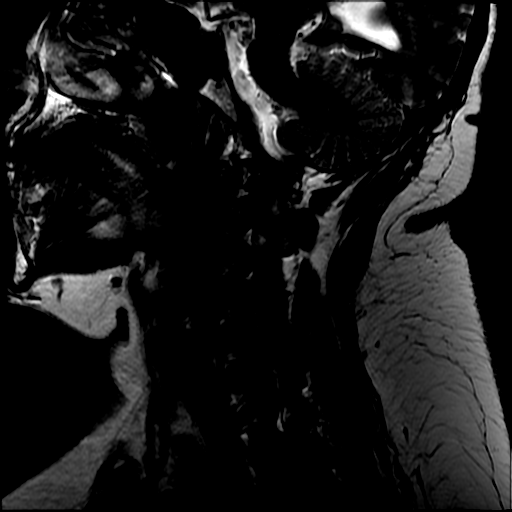
[im 5/13]
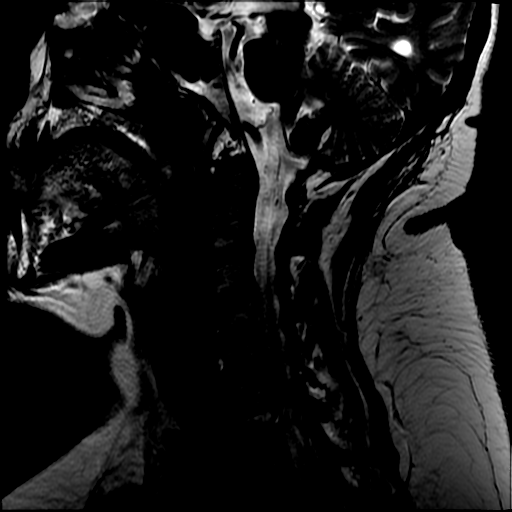
[im 8/13]
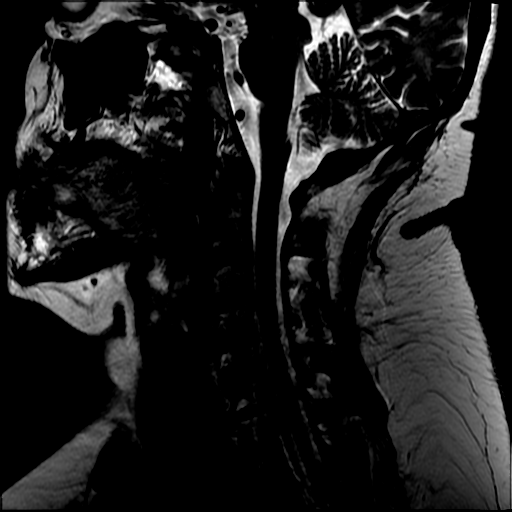
[im 10/13]
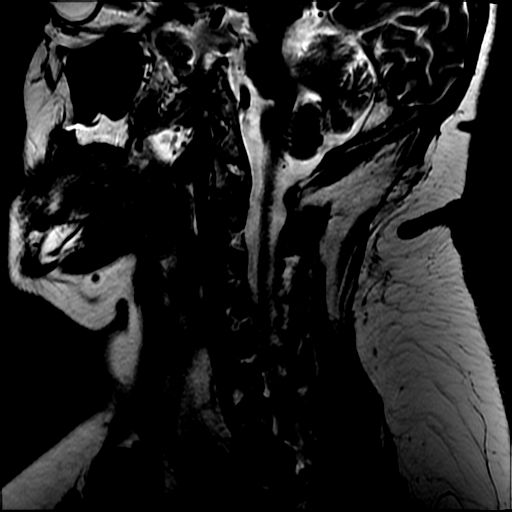
[im 13/13]
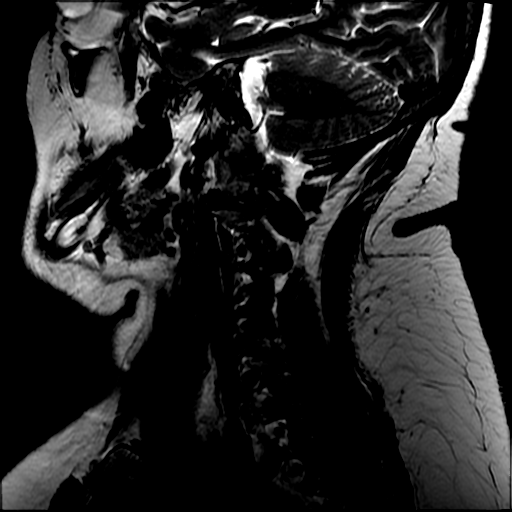

[Series 4: FLAIR · sagittal · 3.0mm · 0.43mm/px · 3 of 13 slices shown]
[im 3/13]
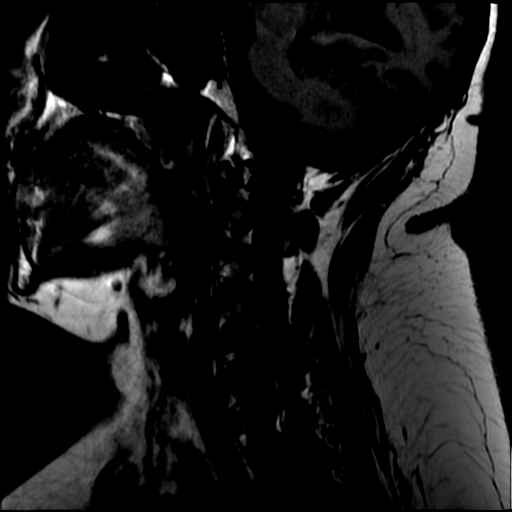
[im 8/13]
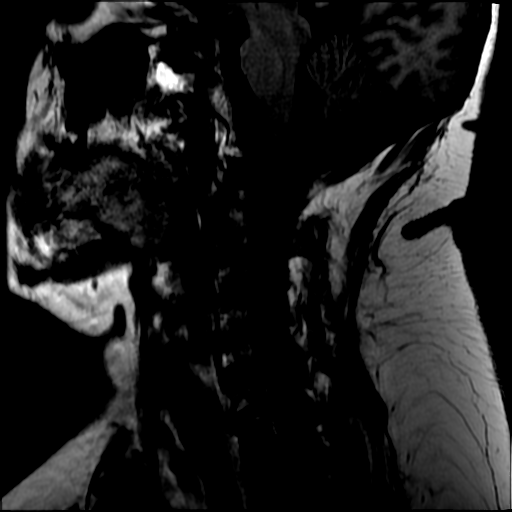
[im 13/13]
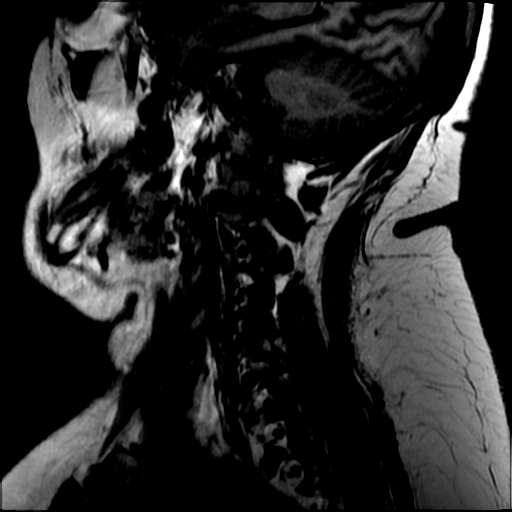

[Series 7: T2 · axial · 3.0mm · 0.35mm/px · z∈[-200,-117]mm · 7 of 30 slices shown (2 of 2)]
[im 1/30]
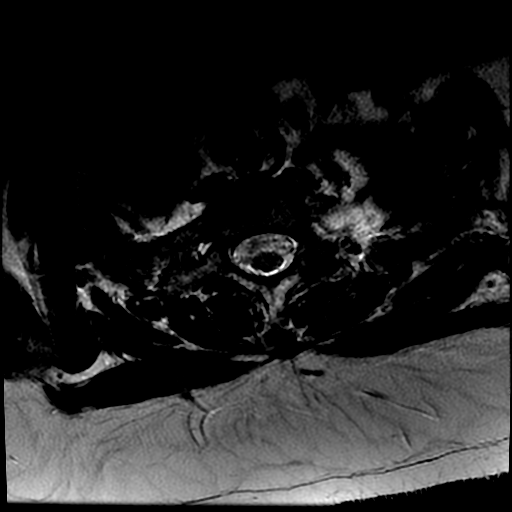
[im 5/30]
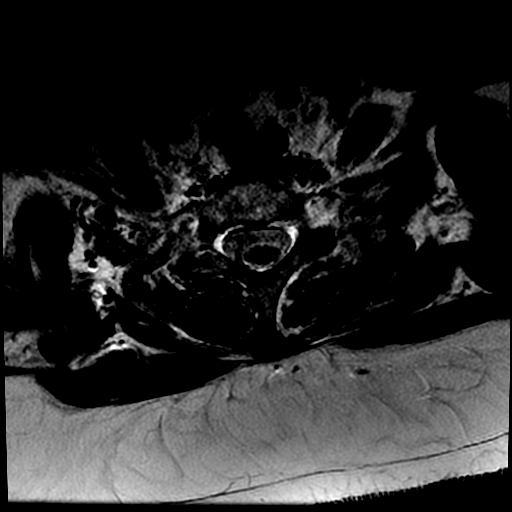
[im 10/30]
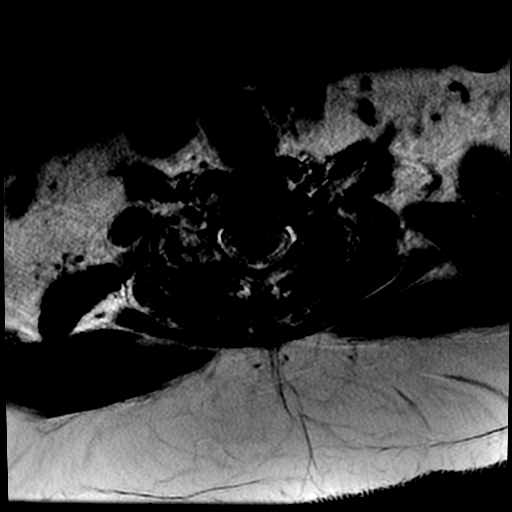
[im 13/30]
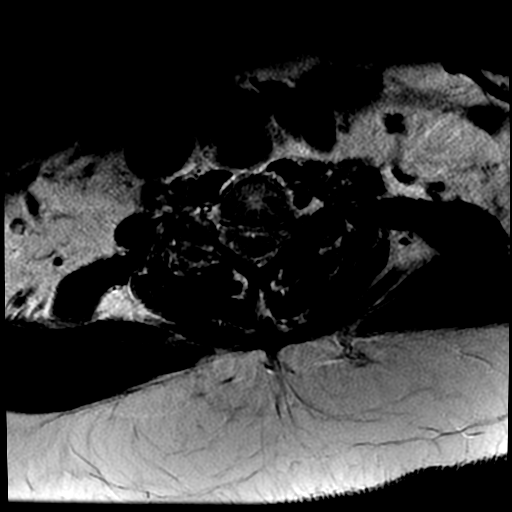
[im 15/30]
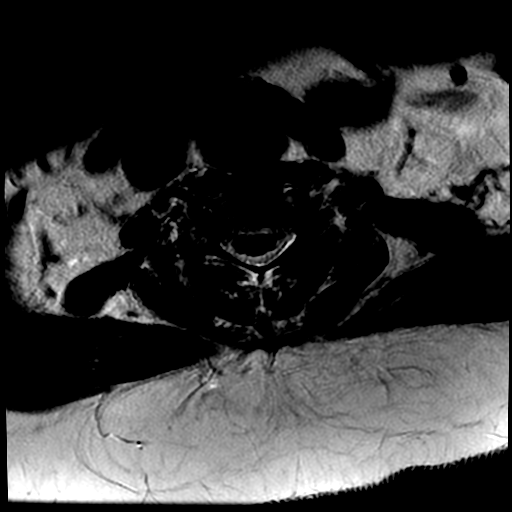
[im 17/30]
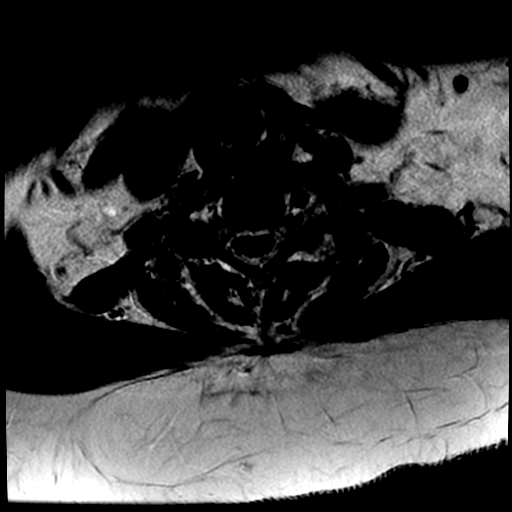
[im 25/30]
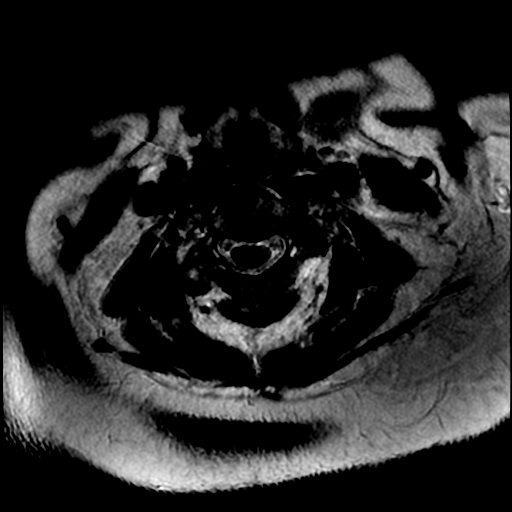

[Series 9: STIR · sagittal · 3.0mm · 0.43mm/px · 3 of 13 slices shown]
[im 3/13]
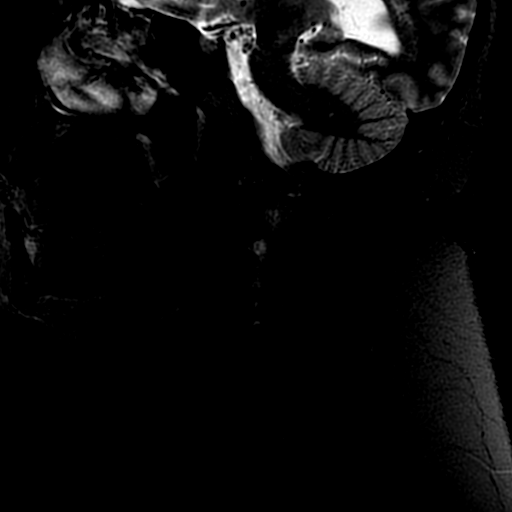
[im 8/13]
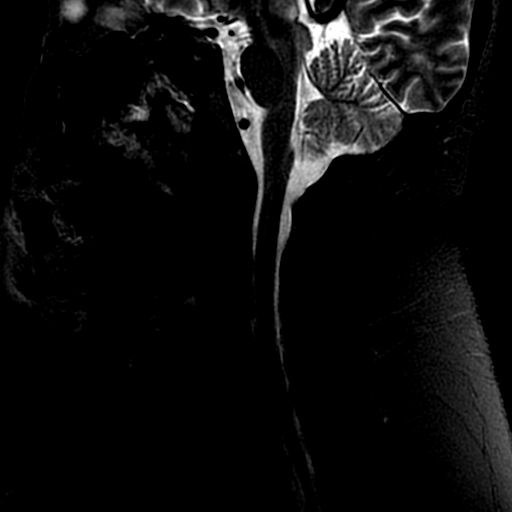
[im 13/13]
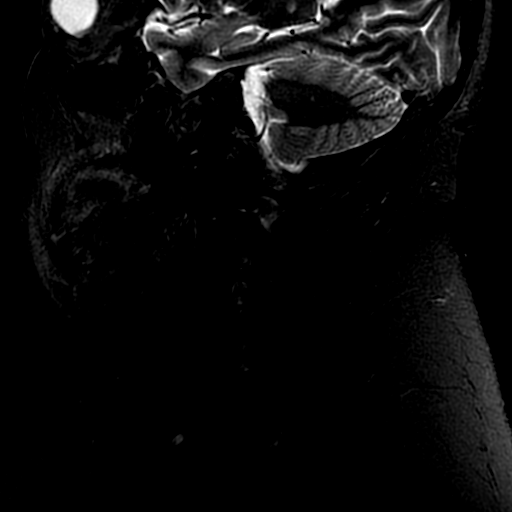

[19 of 48 positions shown; findings below may reference images not displayed]

FINDINGS: The study is motion degraded throughout, mildly to moderately so on
the axial sequences which limits accurate characterization of neural
foraminal stenosis.

Alignment: Normal.

Vertebrae: Preserved vertebral body heights without evidence of
fracture. Moderate disc space narrowing at C6-7 with mild type 2
endplate changes. No significant vertebral marrow edema or
suspicious osseous lesion identified.

Cord: Normal signal and morphology.

Posterior Fossa, vertebral arteries, paraspinal tissues: Hypoplastic
right vertebral artery.

Disc levels:

C2-3:  Moderate right facet arthrosis without stenosis.

C3-4: Left uncovertebral spurring and moderate left facet arthrosis
result in moderate left neural foraminal stenosis without spinal
stenosis.

C4-5: Moderate left facet arthrosis results in at most mild left
neural foraminal stenosis. No spinal stenosis.

C5-6: Mild disc bulging asymmetric to the left, left uncovertebral
spurring, and mild facet arthrosis result in mild left neural
foraminal stenosis without spinal stenosis.

C6-7: Disc bulging and moderate size left foraminal disc protrusion
result in moderate to severe left neural foraminal stenosis with
likely left C7 nerve impingement. No spinal stenosis.

C7-T1:  Negative.
IMPRESSION: 1. C6-7 predominant disc degeneration with moderate to severe left
neural foraminal stenosis and likely C7 nerve impingement due to a
disc protrusion.
2. Mild-to-moderate left-sided neural foraminal stenosis elsewhere
as above.

## 2019-04-20 DIAGNOSIS — Z299 Encounter for prophylactic measures, unspecified: Secondary | ICD-10-CM | POA: Diagnosis not present

## 2019-04-20 DIAGNOSIS — E7800 Pure hypercholesterolemia, unspecified: Secondary | ICD-10-CM | POA: Diagnosis not present

## 2019-04-20 DIAGNOSIS — Z79899 Other long term (current) drug therapy: Secondary | ICD-10-CM | POA: Diagnosis not present

## 2019-04-20 DIAGNOSIS — Z1211 Encounter for screening for malignant neoplasm of colon: Secondary | ICD-10-CM | POA: Diagnosis not present

## 2019-04-20 DIAGNOSIS — I10 Essential (primary) hypertension: Secondary | ICD-10-CM | POA: Diagnosis not present

## 2019-04-20 DIAGNOSIS — Z0000 Encounter for general adult medical examination without abnormal findings: Secondary | ICD-10-CM | POA: Diagnosis not present

## 2019-04-20 DIAGNOSIS — R5383 Other fatigue: Secondary | ICD-10-CM | POA: Diagnosis not present

## 2019-04-20 DIAGNOSIS — Z7189 Other specified counseling: Secondary | ICD-10-CM | POA: Diagnosis not present

## 2019-04-20 DIAGNOSIS — E039 Hypothyroidism, unspecified: Secondary | ICD-10-CM | POA: Diagnosis not present

## 2019-05-12 DIAGNOSIS — E039 Hypothyroidism, unspecified: Secondary | ICD-10-CM | POA: Diagnosis not present

## 2019-05-12 DIAGNOSIS — J440 Chronic obstructive pulmonary disease with acute lower respiratory infection: Secondary | ICD-10-CM | POA: Diagnosis not present

## 2019-05-12 DIAGNOSIS — I10 Essential (primary) hypertension: Secondary | ICD-10-CM | POA: Diagnosis not present

## 2019-05-12 DIAGNOSIS — Z299 Encounter for prophylactic measures, unspecified: Secondary | ICD-10-CM | POA: Diagnosis not present

## 2019-05-12 DIAGNOSIS — J209 Acute bronchitis, unspecified: Secondary | ICD-10-CM | POA: Diagnosis not present

## 2019-08-13 DIAGNOSIS — I129 Hypertensive chronic kidney disease with stage 1 through stage 4 chronic kidney disease, or unspecified chronic kidney disease: Secondary | ICD-10-CM | POA: Diagnosis not present

## 2019-08-13 DIAGNOSIS — N183 Chronic kidney disease, stage 3 unspecified: Secondary | ICD-10-CM | POA: Diagnosis not present

## 2019-08-13 DIAGNOSIS — E039 Hypothyroidism, unspecified: Secondary | ICD-10-CM | POA: Diagnosis not present

## 2019-08-13 DIAGNOSIS — E785 Hyperlipidemia, unspecified: Secondary | ICD-10-CM | POA: Diagnosis not present

## 2019-08-20 DIAGNOSIS — Z299 Encounter for prophylactic measures, unspecified: Secondary | ICD-10-CM | POA: Diagnosis not present

## 2019-08-20 DIAGNOSIS — J209 Acute bronchitis, unspecified: Secondary | ICD-10-CM | POA: Diagnosis not present

## 2019-08-20 DIAGNOSIS — J440 Chronic obstructive pulmonary disease with acute lower respiratory infection: Secondary | ICD-10-CM | POA: Diagnosis not present

## 2019-11-12 DIAGNOSIS — E7849 Other hyperlipidemia: Secondary | ICD-10-CM | POA: Diagnosis not present

## 2019-11-12 DIAGNOSIS — I10 Essential (primary) hypertension: Secondary | ICD-10-CM | POA: Diagnosis not present

## 2019-11-12 DIAGNOSIS — E039 Hypothyroidism, unspecified: Secondary | ICD-10-CM | POA: Diagnosis not present

## 2019-12-13 DIAGNOSIS — Z299 Encounter for prophylactic measures, unspecified: Secondary | ICD-10-CM | POA: Diagnosis not present

## 2019-12-13 DIAGNOSIS — J209 Acute bronchitis, unspecified: Secondary | ICD-10-CM | POA: Diagnosis not present

## 2019-12-13 DIAGNOSIS — J440 Chronic obstructive pulmonary disease with acute lower respiratory infection: Secondary | ICD-10-CM | POA: Diagnosis not present

## 2020-01-14 DIAGNOSIS — E039 Hypothyroidism, unspecified: Secondary | ICD-10-CM | POA: Diagnosis not present

## 2020-01-14 DIAGNOSIS — I10 Essential (primary) hypertension: Secondary | ICD-10-CM | POA: Diagnosis not present

## 2020-01-14 DIAGNOSIS — E7849 Other hyperlipidemia: Secondary | ICD-10-CM | POA: Diagnosis not present

## 2020-02-14 DIAGNOSIS — E7849 Other hyperlipidemia: Secondary | ICD-10-CM | POA: Diagnosis not present

## 2020-02-14 DIAGNOSIS — I10 Essential (primary) hypertension: Secondary | ICD-10-CM | POA: Diagnosis not present

## 2020-02-14 DIAGNOSIS — E039 Hypothyroidism, unspecified: Secondary | ICD-10-CM | POA: Diagnosis not present

## 2020-03-23 DIAGNOSIS — Z299 Encounter for prophylactic measures, unspecified: Secondary | ICD-10-CM | POA: Diagnosis not present

## 2020-03-23 DIAGNOSIS — E039 Hypothyroidism, unspecified: Secondary | ICD-10-CM | POA: Diagnosis not present

## 2020-03-23 DIAGNOSIS — N1831 Chronic kidney disease, stage 3a: Secondary | ICD-10-CM | POA: Diagnosis not present

## 2020-03-23 DIAGNOSIS — I10 Essential (primary) hypertension: Secondary | ICD-10-CM | POA: Diagnosis not present

## 2020-04-12 DIAGNOSIS — I10 Essential (primary) hypertension: Secondary | ICD-10-CM | POA: Diagnosis not present

## 2020-04-12 DIAGNOSIS — E039 Hypothyroidism, unspecified: Secondary | ICD-10-CM | POA: Diagnosis not present

## 2020-04-12 DIAGNOSIS — E7849 Other hyperlipidemia: Secondary | ICD-10-CM | POA: Diagnosis not present

## 2020-04-25 DIAGNOSIS — I10 Essential (primary) hypertension: Secondary | ICD-10-CM | POA: Diagnosis not present

## 2020-04-25 DIAGNOSIS — Z299 Encounter for prophylactic measures, unspecified: Secondary | ICD-10-CM | POA: Diagnosis not present

## 2020-04-25 DIAGNOSIS — Z1211 Encounter for screening for malignant neoplasm of colon: Secondary | ICD-10-CM | POA: Diagnosis not present

## 2020-04-25 DIAGNOSIS — Z0000 Encounter for general adult medical examination without abnormal findings: Secondary | ICD-10-CM | POA: Diagnosis not present

## 2020-04-25 DIAGNOSIS — Z79899 Other long term (current) drug therapy: Secondary | ICD-10-CM | POA: Diagnosis not present

## 2020-04-25 DIAGNOSIS — F17210 Nicotine dependence, cigarettes, uncomplicated: Secondary | ICD-10-CM | POA: Diagnosis not present

## 2020-04-25 DIAGNOSIS — Z7189 Other specified counseling: Secondary | ICD-10-CM | POA: Diagnosis not present

## 2020-04-25 DIAGNOSIS — E7800 Pure hypercholesterolemia, unspecified: Secondary | ICD-10-CM | POA: Diagnosis not present

## 2020-04-25 DIAGNOSIS — R5383 Other fatigue: Secondary | ICD-10-CM | POA: Diagnosis not present

## 2020-05-25 DIAGNOSIS — R110 Nausea: Secondary | ICD-10-CM | POA: Diagnosis not present

## 2020-05-25 DIAGNOSIS — R5381 Other malaise: Secondary | ICD-10-CM | POA: Diagnosis not present

## 2020-06-05 DIAGNOSIS — J069 Acute upper respiratory infection, unspecified: Secondary | ICD-10-CM | POA: Diagnosis not present

## 2020-06-05 DIAGNOSIS — Z299 Encounter for prophylactic measures, unspecified: Secondary | ICD-10-CM | POA: Diagnosis not present

## 2020-06-05 DIAGNOSIS — I10 Essential (primary) hypertension: Secondary | ICD-10-CM | POA: Diagnosis not present

## 2020-06-05 DIAGNOSIS — N1831 Chronic kidney disease, stage 3a: Secondary | ICD-10-CM | POA: Diagnosis not present

## 2020-06-08 ENCOUNTER — Ambulatory Visit: Payer: Medicare Other | Admitting: Gastroenterology

## 2020-06-12 DIAGNOSIS — I10 Essential (primary) hypertension: Secondary | ICD-10-CM | POA: Diagnosis not present

## 2020-06-12 DIAGNOSIS — M170 Bilateral primary osteoarthritis of knee: Secondary | ICD-10-CM | POA: Diagnosis not present

## 2020-07-04 ENCOUNTER — Encounter: Payer: Self-pay | Admitting: Gastroenterology

## 2020-07-04 ENCOUNTER — Ambulatory Visit: Payer: Medicare Other | Admitting: Gastroenterology

## 2020-07-04 ENCOUNTER — Telehealth: Payer: Self-pay

## 2020-07-04 VITALS — BP 140/86 | HR 72 | Ht 64.5 in | Wt 340.5 lb

## 2020-07-04 DIAGNOSIS — K921 Melena: Secondary | ICD-10-CM | POA: Diagnosis not present

## 2020-07-04 DIAGNOSIS — Z7901 Long term (current) use of anticoagulants: Secondary | ICD-10-CM | POA: Diagnosis not present

## 2020-07-04 NOTE — Patient Instructions (Addendum)
It was my pleasure to provide care to you you today. Based on our discussion, I am providing you with my recommendations below:  RECOMMENDATION(S):   You have been scheduled for a colonoscopy. Please follow written instructions given to you at your visit today.   PREP:   Please pick up your prep supplies at the pharmacy within the next 1-3 days.  INHALERS:   If you use inhalers (even only as needed), please bring them with you on the day of your procedure.  MEDICATIONS TO HOLD:  We will contact your provider to request permission for you to hold Plavix. Once we receive a response, you will be contacted by our office. If you do not hear from our office 1 week prior to your scheduled procedure, please contact our office at [phone].   COLONOSCOPY TIPS:  To reduce nausea and dehydration, stay well hydrated for 3-4 days prior to the exam.  To prevent skin/hemorrhoid irritation - prior to wiping, put A&Dointment or vaseline on the toilet paper. Keep a towel or pad on the bed.  BEFORE STARTING YOUR PREP, drink  64oz of clear liquids in the morning. This will help to flush the colon and will ensure you are well hydrated!!!!  NOTE - This is in addition to the fluids required for to complete your prep. Use of a flavored hard candy, such as grape Anise Salvo, can counteract some of the flavor of the prep and may prevent some nausea.   FOLLOW UP:  I would like for you to follow up with me in ???. Please call the office at [phone] to schedule your appointment. After your procedure, you will receive a call from my office staff regarding my recommendation for follow up.  BMI:  If you are age 72 or older, your body mass index should be between 23-30. Your Body mass index is 57.54 kg/m. If this is out of the aforementioned range listed, please consider follow up with your Primary Care Provider.  MY CHART:  The [place] [facility] providers would like to encourage you to use [facility] to  communicate with providers for non-urgent requests or questions.  Due to long hold times on the telephone, sending your provider a message by [facility] may be a faster and more efficient way to get a response.  Please allow 48 business hours for a response.  Please remember that this is for non-urgent requests.   Thank you for trusting me with your gastrointestinal care!    Thornton Park, MD, MPH

## 2020-07-04 NOTE — Progress Notes (Signed)
Referring Provider: Redmond School, MD Primary Care Physician:  Glenda Chroman, MD  Reason for Consultation:  Blood in the stool   IMPRESSION:  Hemoccult + BMI 57.54 Long-term use of Plavix for prior CVA Prior colonoscopy   Colonoscopy recommended. She has considerable anxiety around colonoscopy and we have extensively discussed strategies to help her get through the prep and the procedure. Will need to hold Plavix for 5 days before endoscopy.  I discussed with the patient that there is a low, but real, risk of a cardiovascular event such as heart attack, stroke, or embolism/thrombosis while off Plavix. Will communicate with Dr. Woody Seller to confirm that holding the Plavix is appropriate at this time.   PLAN: Colonoscopy at the hospital after a Plavix washout  Please see the "Patient Instructions" section for addition details about the plan.  I spent 45 minutes, including in depth chart review, independent review of results, communicating results with the patient directly, face-to-face time with the patient, coordinating care, ordering studies and medications as appropriate, and documentation.    HPI: Michelle Roth is a 70 y.o. female referred by Dr. Woody Seller for hemoccult positive stools. She has a history of panic disorder and has significant anxiety around colonoscopy. She also has hypertension, reflux, degenerative disk disease, IBS, and a history of vasovagal response following colonoscopy. She has had a hysterectomy and a cholecystectomy. She uses a wheelchair. Her husband accompanies her to this appointment.   Hemoccult positive on routine annual labs. No overt GI blood loss. No melena, hematochezia, bright red blood per rectum.   Reports a normal colonoscopy in Dr. Arnoldo Morale in 2012. Although records in [place] suggest that it may have been 2006.   Son recently had 9 colon polyps removed. Distant family members with colon cancer. No first degree family history of colon cancer or polyps. No  family history of uterine/endometrial cancer, pancreatic cancer or gastric/stomach cancer.   Past Medical History:  Diagnosis Date   Anxiety    Asthmatic bronchitis    Depression    Dysrhythmia    Elevated cholesterol    Fibromyalgia    Graves disease    Hypertension    Hypothyroidism    Obesity    Polio    age 49   Prediabetes    Shortness of breath dyspnea    Sleep apnea    Status post dilation of esophageal narrowing    Stroke [facility]) 2017   UTI (lower urinary tract infection)     Past Surgical History:  Procedure Laterality Date   ABDOMINAL HYSTERECTOMY  1977   APPENDECTOMY     BREAST SURGERY Left    Lumpectomy/ Benign   CATARACT EXTRACTION W/PHACO Right 05/10/2014   Procedure: CATARACT EXTRACTION PHACO AND INTRAOCULAR LENS PLACEMENT ([place]);  Surgeon: Rutherford Guys, MD;  Location: [facility];  Service: Ophthalmology;  Laterality: Right;  CDE:6.25   CATARACT EXTRACTION W/PHACO Left 06/14/2014   Procedure: CATARACT EXTRACTION PHACO AND INTRAOCULAR LENS PLACEMENT ([facility]);  Surgeon: Rutherford Guys, MD;  Location: [facility];  Service: Ophthalmology;  Laterality: Left;  CDE:4.19   CHOLECYSTECTOMY     KNEE ARTHROPLASTY Right    REPLACEMENT TOTAL KNEE Left    TOTAL THYROIDECTOMY Bilateral    URETHRAL CYST REMOVAL      Current Outpatient Medications  Medication Sig Dispense Refill   ALPRAZolam (XANAX) 1 MG tablet Take 1.5 mg by mouth daily at 12 noon.     bisoprolol-hydrochlorothiazide (ZIAC) 2.5-6.25 MG per tablet Take 1 tablet by mouth daily.  clopidogrel (PLAVIX) 75 MG tablet Take 1 tablet (75 mg total) by mouth daily. 30 tablet 0   HYDROcodone-acetaminophen (NORCO) 10-325 MG per tablet Take 1 tablet by mouth every 6 (six) hours as needed for moderate pain.     hydrOXYzine (VISTARIL) 25 MG capsule Take 25 mg by mouth every 6 (six) hours as needed for anxiety.     levothyroxine (SYNTHROID) 125 MCG tablet Take 1 tablet by mouth daily.     sertraline (ZOLOFT) 100 MG tablet Take 50-100  mg by mouth daily. Take 50mg  every morning  Take 100mg  every night     No current facility-administered medications for this visit.    Allergies as of 07/04/2020 - Review Complete 07/04/2020  Allergen Reaction Noted   Biaxin [clarithromycin]  05/03/2014   Macrobid [nitrofurantoin monohyd macro] Nausea Only 05/03/2014    Family History  Problem Relation Age of Onset   Hyperlipidemia Mother    AAA (abdominal aortic aneurysm) Mother    Heart attack Mother    Heart failure Mother    Heart disease Father    AAA (abdominal aortic aneurysm) Father    Colon cancer Maternal Grandmother    Heart disease Paternal Grandmother    Colon cancer Maternal Aunt    Colon polyps Son    Hypertension Son     Social History   Socioeconomic History   Marital status: Married    Spouse name: Not on file   Number of children: 2   Years of education: Not on file   Highest education level: Not on file  Occupational History   Occupation: retired  Tobacco Use   Smoking status: Former    Packs/day: 0.25    Years: 16.00    Pack years: 4.00    Types: Cigarettes    Quit date: 05/05/1985    Years since quitting: 35.1   Smokeless tobacco: Never  Vaping Use   Vaping Use: Never used  Substance and Sexual Activity   Alcohol use: No   Drug use: No   Sexual activity: Not on file  Other Topics Concern   Not on file  Social History Narrative   Not on file   Social Determinants of Health   Financial Resource Strain: Not on file  Food Insecurity: Not on file  Transportation Needs: Not on file  Physical Activity: Not on file  Stress: Not on file  Social Connections: Not on file  Intimate Partner Violence: Not on file    Review of Systems: 12 system ROS is negative except as noted above.   Physical Exam: General:   Alert,  well-nourished, pleasant and cooperative in NAD Head:  Normocephalic and atraumatic. Eyes:  Sclera clear, no icterus.   Conjunctiva pink. Ears:  Normal auditory  acuity. Nose:  No deformity, discharge,  or lesions. Mouth:  No deformity or lesions.   Neck:  Supple; no masses or thyromegaly. Lungs:  Clear throughout to auscultation.   No wheezes. Heart:  Regular rate and rhythm; no murmurs. Abdomen:  Soft,nontender, nondistended, normal bowel sounds, no rebound or guarding. No hepatosplenomegaly.   Rectal:  Deferred  Msk:  Symmetrical. No boney deformities LAD: No inguinal or umbilical LAD Extremities:  No clubbing or edema. Neurologic:  Alert and  oriented x4;  grossly nonfocal Skin:  Intact without significant lesions or rashes. Psych:  Alert and cooperative. Normal mood and affect.     Dorella Laster L. Tarri Glenn, MD, MPH 07/04/2020, 12:28 PM

## 2020-07-04 NOTE — Telephone Encounter (Signed)
Pre-operative Risk Assessment     Michelle Roth 11/26/1950 [id number]  Procedure: Colonoscopy Anesthesia type:  MAC Procedure Date: 08/24/20 Provider: Dr. Tarri Glenn  Type of Clearance needed: Pharmacy  Medication(s) needing held: Plavix   Length of time for medication to be held: 5 days  Please review request and advise by either responding to this message or by sending your response to the fax # provided below.  Thank you,  [facility]  Phone: [phone] Fax: [phone] ATTENTION: Taryn Shellhammer, LPN

## 2020-07-10 NOTE — Telephone Encounter (Signed)
Chart Review Routing History Since 07/12/2019 [facility] Full Routing History)  Recipients Sent On Sent By Routed Reports   Dr. Jerene Bears   07/10/2020 10:23 AM Aleatha Borer, LPN Telephone on 01/17/1279 with Aleatha Borer, LPN      Cover Page Message : Please review and advise

## 2020-07-13 DIAGNOSIS — I10 Essential (primary) hypertension: Secondary | ICD-10-CM | POA: Diagnosis not present

## 2020-07-13 DIAGNOSIS — M170 Bilateral primary osteoarthritis of knee: Secondary | ICD-10-CM | POA: Diagnosis not present

## 2020-07-20 DIAGNOSIS — E039 Hypothyroidism, unspecified: Secondary | ICD-10-CM | POA: Diagnosis not present

## 2020-07-20 DIAGNOSIS — I10 Essential (primary) hypertension: Secondary | ICD-10-CM | POA: Diagnosis not present

## 2020-07-20 DIAGNOSIS — Z299 Encounter for prophylactic measures, unspecified: Secondary | ICD-10-CM | POA: Diagnosis not present

## 2020-07-20 NOTE — Telephone Encounter (Signed)
Obtained updated fax # for Dr. Woody Seller = [phone]

## 2020-07-20 NOTE — Telephone Encounter (Signed)
SECOND ATTEMPT:   Chart Review Routing History Since 07/22/2019 [facility] Full Routing History)  Recipients Sent On Sent By Routed Reports   Dr. Woody Seller   07/20/2020  4:44 PM Aleatha Borer, LPN Telephone on 1/77/1165 with Aleatha Borer, LPN      Cover Page Message : Please review and advise

## 2020-07-26 NOTE — Telephone Encounter (Signed)
THIRD ATTEMPT:  Called Dr. Woody Seller' office to follow up on status of clearance. Was transferred to nurse line and LVM requesting returned call.

## 2020-07-27 NOTE — Telephone Encounter (Signed)
Received returned call from Dr. Woody Seller' office. Spoke with [employer]. Asked me to refax request again to [phone]. Documents have been refaxed again as requested.  Chart Review Routing History Since 07/29/2019 (View Full Routing History)  Recipients Sent On Sent By Routed Reports   CINDY   07/27/2020  2:24 PM Aleatha Borer, LPN Telephone on 5/46/2703 with Aleatha Borer, LPN      Cover Page Message : URGENT!!

## 2020-07-27 NOTE — Telephone Encounter (Signed)
Called Cindy at Dr. Woody Seller' office to confirm if she had received the fax. Transferred to VM. LVM requesting returned call. Efforts continue to obtain clearance at this time.

## 2020-07-31 NOTE — Telephone Encounter (Signed)
Following message sent via My Chart:  Hi Chantale,  We have received notification from Dr. Woody Seller providing permission for you to hold Plavix x5 days PRIOR to your colonoscopy. We have tried to call you but your phone has been disconnecting our calls. Therefore, we are sending you this My Chart message to make you aware. Please let us know if you have any questions or concerns regarding this information.  Thank you, [facility]

## 2020-07-31 NOTE — Telephone Encounter (Signed)
Never received returned call from [facility]. Called today to follow up. Spoke with Michelle Roth. Advised about the numerous failed attempts to receive correspondence from their office despite successful faxed attempts on our behalf. Once again, Michelle Roth asked that we fax request to [phone]. Asked for an alternate fax# as we have been asked to send our request multiple times to this # and yet all parties insist they have never received request despite confirmations. Michelle Roth states this is their only fax#. Advised this will be our FINAL attempt. Otherwise, will mail our request hereafter. Asked Michelle Roth to call our office REGARDLESS of receipt or lack there of. Verbalized acceptance and understanding.  Faxed request again consecutively x4 to [phone], ALL with confirmations received indicating faxes did in fact go through. Efforts continue at this time. Failure to receive phone call about having received OR NOT received fax and/or failure to receive correspondence re: clearance, will result in need to mail to:  [address]  [place], [place] [address]

## 2020-07-31 NOTE — Telephone Encounter (Signed)
Received faxed notification from Dr. Woody Seller' office indicating pt is permitted to hold Plavix x5 days.

## 2020-08-14 ENCOUNTER — Telehealth: Payer: Self-pay | Admitting: Gastroenterology

## 2020-08-14 NOTE — Telephone Encounter (Signed)
Called pt to request clarification of her question as her prep is NOT by Rx but rather OTC products that she could purchase at a grocery store or pharmacy. Pt states she was asking about holding her Plavix. Advised we had made several attempts to reach her via phone but phone continued to disconnect our calls. Therefore, My Chart message had been sent. Asked if she had seen the message. Pt confirmed seeing message. Asked if she could elaborate further re: her concern/question. Pt states she wondered if she could add another provider to her My Chart. Advised she contact My Chart for further instructions as this would not be our area of expertise. When asked, pt declined having any other questions at this time. Asked if she had purchased her prep supplies? Pt confirmed having prep supplies. Asked if she had specific questions about her prep instructions or was needing further clarification? Pt declined having any questions or confusion about her prep instructions. Asked if she understood when to begin holding her Plavix? Pt states she knows when to hold her Plavix and advised she had no further questions at this time.

## 2020-08-16 ENCOUNTER — Encounter (HOSPITAL_COMMUNITY): Payer: Self-pay | Admitting: Gastroenterology

## 2020-08-16 ENCOUNTER — Other Ambulatory Visit: Payer: Self-pay

## 2020-08-23 NOTE — Anesthesia Preprocedure Evaluation (Addendum)
Anesthesia Evaluation  Patient identified by MRN, date of birth, ID band Patient awake    Reviewed: Allergy & Precautions, NPO status , Patient's Chart, lab work & pertinent test results  Airway Mallampati: III  TM Distance: <3 FB Neck ROM: Full    Dental no notable dental hx.    Pulmonary sleep apnea , former smoker,    Pulmonary exam normal breath sounds clear to auscultation       Cardiovascular hypertension, Normal cardiovascular exam Rhythm:Regular Rate:Normal     Neuro/Psych H/O polio  Neuromuscular disease CVA negative psych ROS   GI/Hepatic negative GI ROS, Neg liver ROS,   Endo/Other  negative endocrine ROS  Renal/GU negative Renal ROS  negative genitourinary   Musculoskeletal negative musculoskeletal ROS (+)   Abdominal   Peds negative pediatric ROS (+)  Hematology negative hematology ROS (+)   Anesthesia Other Findings   Reproductive/Obstetrics negative OB ROS                            Anesthesia Physical Anesthesia Plan  ASA: 3  Anesthesia Plan: MAC   Post-op Pain Management:    Induction: Intravenous  PONV Risk Score and Plan: 2 and Propofol infusion  Airway Management Planned: Simple Face Mask  Additional Equipment:   Intra-op Plan:   Post-operative Plan:   Informed Consent: I have reviewed the patients History and Physical, chart, labs and discussed the procedure including the risks, benefits and alternatives for the proposed anesthesia with the patient or authorized representative who has indicated his/her understanding and acceptance.     Dental advisory given  Plan Discussed with: CRNA and Surgeon  Anesthesia Plan Comments:         Anesthesia Quick Evaluation

## 2020-08-24 ENCOUNTER — Ambulatory Visit (HOSPITAL_COMMUNITY)
Admission: RE | Admit: 2020-08-24 | Discharge: 2020-08-24 | Disposition: A | Discharge status: Home / Self Care | Payer: Medicare Other | Attending: Gastroenterology | Admitting: Gastroenterology

## 2020-08-24 ENCOUNTER — Ambulatory Visit (HOSPITAL_COMMUNITY): Payer: Medicare Other | Admitting: Anesthesiology

## 2020-08-24 ENCOUNTER — Encounter (HOSPITAL_COMMUNITY)
Admission: RE | Disposition: A | Discharge status: Home / Self Care | Payer: Self-pay | Source: Home / Self Care | Attending: Gastroenterology

## 2020-08-24 ENCOUNTER — Other Ambulatory Visit: Payer: Self-pay

## 2020-08-24 ENCOUNTER — Encounter (HOSPITAL_COMMUNITY): Payer: Self-pay | Admitting: Gastroenterology

## 2020-08-24 DIAGNOSIS — E669 Obesity, unspecified: Secondary | ICD-10-CM | POA: Diagnosis not present

## 2020-08-24 DIAGNOSIS — Z881 Allergy status to other antibiotic agents status: Secondary | ICD-10-CM | POA: Diagnosis not present

## 2020-08-24 DIAGNOSIS — D122 Benign neoplasm of ascending colon: Secondary | ICD-10-CM | POA: Insufficient documentation

## 2020-08-24 DIAGNOSIS — I10 Essential (primary) hypertension: Secondary | ICD-10-CM | POA: Insufficient documentation

## 2020-08-24 DIAGNOSIS — D125 Benign neoplasm of sigmoid colon: Secondary | ICD-10-CM | POA: Diagnosis not present

## 2020-08-24 DIAGNOSIS — Z90710 Acquired absence of both cervix and uterus: Secondary | ICD-10-CM | POA: Diagnosis not present

## 2020-08-24 DIAGNOSIS — Z96652 Presence of left artificial knee joint: Secondary | ICD-10-CM | POA: Insufficient documentation

## 2020-08-24 DIAGNOSIS — D124 Benign neoplasm of descending colon: Secondary | ICD-10-CM | POA: Diagnosis not present

## 2020-08-24 DIAGNOSIS — Z7902 Long term (current) use of antithrombotics/antiplatelets: Secondary | ICD-10-CM | POA: Diagnosis not present

## 2020-08-24 DIAGNOSIS — Z9049 Acquired absence of other specified parts of digestive tract: Secondary | ICD-10-CM | POA: Diagnosis not present

## 2020-08-24 DIAGNOSIS — Z8249 Family history of ischemic heart disease and other diseases of the circulatory system: Secondary | ICD-10-CM | POA: Insufficient documentation

## 2020-08-24 DIAGNOSIS — Z8371 Family history of colonic polyps: Secondary | ICD-10-CM | POA: Diagnosis not present

## 2020-08-24 DIAGNOSIS — Z8612 Personal history of poliomyelitis: Secondary | ICD-10-CM | POA: Insufficient documentation

## 2020-08-24 DIAGNOSIS — K5730 Diverticulosis of large intestine without perforation or abscess without bleeding: Secondary | ICD-10-CM | POA: Diagnosis not present

## 2020-08-24 DIAGNOSIS — Z6841 Body Mass Index (BMI) 40.0 and over, adult: Secondary | ICD-10-CM | POA: Insufficient documentation

## 2020-08-24 DIAGNOSIS — R7303 Prediabetes: Secondary | ICD-10-CM | POA: Insufficient documentation

## 2020-08-24 DIAGNOSIS — E039 Hypothyroidism, unspecified: Secondary | ICD-10-CM | POA: Insufficient documentation

## 2020-08-24 DIAGNOSIS — Z800 Family history of malignant neoplasm of digestive organs: Secondary | ICD-10-CM | POA: Insufficient documentation

## 2020-08-24 DIAGNOSIS — R195 Other fecal abnormalities: Secondary | ICD-10-CM | POA: Insufficient documentation

## 2020-08-24 DIAGNOSIS — K635 Polyp of colon: Secondary | ICD-10-CM | POA: Diagnosis not present

## 2020-08-24 DIAGNOSIS — K621 Rectal polyp: Secondary | ICD-10-CM

## 2020-08-24 DIAGNOSIS — E7800 Pure hypercholesterolemia, unspecified: Secondary | ICD-10-CM | POA: Insufficient documentation

## 2020-08-24 DIAGNOSIS — Z8673 Personal history of transient ischemic attack (TIA), and cerebral infarction without residual deficits: Secondary | ICD-10-CM | POA: Insufficient documentation

## 2020-08-24 DIAGNOSIS — K921 Melena: Secondary | ICD-10-CM

## 2020-08-24 HISTORY — PX: POLYPECTOMY: SHX5525

## 2020-08-24 HISTORY — PX: COLONOSCOPY WITH PROPOFOL: SHX5780

## 2020-08-24 SURGERY — COLONOSCOPY WITH PROPOFOL
Anesthesia: Monitor Anesthesia Care

## 2020-08-24 MED ORDER — SODIUM CHLORIDE 0.9 % IV SOLN: INTRAVENOUS | Status: DC

## 2020-08-24 MED ORDER — PROPOFOL 500 MG/50ML IV EMUL
INTRAVENOUS | Status: DC | PRN
  Administered 2020-08-24: 100 ug/kg/min via INTRAVENOUS

## 2020-08-24 MED ORDER — LACTATED RINGERS IV SOLN: INTRAVENOUS | Status: DC

## 2020-08-24 SURGICAL SUPPLY — 21 items

## 2020-08-24 NOTE — Discharge Instructions (Signed)

## 2020-08-24 NOTE — Anesthesia Postprocedure Evaluation (Signed)
Anesthesia Post Note  Patient: Michelle Roth  Procedure(s) Performed: COLONOSCOPY WITH PROPOFOL POLYPECTOMY     Patient location during evaluation: PACU Anesthesia Type: MAC Level of consciousness: awake and alert Pain management: pain level controlled Vital Signs Assessment: post-procedure vital signs reviewed and stable Respiratory status: spontaneous breathing, nonlabored ventilation, respiratory function stable and patient connected to nasal cannula oxygen Cardiovascular status: stable and blood pressure returned to baseline Postop Assessment: no apparent nausea or vomiting Anesthetic complications: no   No notable events documented.  Last Vitals:  Vitals:   08/24/20 1110 08/24/20 1120  BP: 102/63 (!) 150/62  Pulse: 63 (!) 55  Resp: 16 19  Temp:    SpO2: 95% 100%    Last Pain:  Vitals:   08/24/20 1120  TempSrc:   PainSc: 0-No pain                 Arion Morgan S

## 2020-08-24 NOTE — H&P (Signed)
Referring Provider: No ref. provider found Primary Care Physician:  Glenda Chroman, MD  Reason for Consultation:  Blood in the stool   IMPRESSION:  Hemoccult + BMI 57.54 Long-term use of Plavix for prior CVA Prior colonoscopy   Colonoscopy recommended.   PLAN: Colonoscopy at the hospital today    HPI: Michelle Roth is a 70 y.o. female who presents for colonoscopy to evaluate hemoccult positive stools. She has a history of panic disorder and has significant anxiety around colonoscopy. She also has hypertension, reflux, degenerative disk disease, IBS, and a history of vasovagal response following colonoscopy. She has had a hysterectomy and a cholecystectomy. She uses a wheelchair. Her husband accompanies her to this appointment.   Hemoccult positive on routine annual labs. No overt GI blood loss. No melena, hematochezia, bright red blood per rectum.   Reports a normal colonoscopy in Dr. Arnoldo Morale in 2012. Although records in [place] suggest that it may have been 2006.   Son recently had 9 colon polyps removed. Distant family members with colon cancer. No first degree family history of colon cancer or polyps. No family history of uterine/endometrial cancer, pancreatic cancer or gastric/stomach cancer.   Past Medical History:  Diagnosis Date   Anxiety    Asthmatic bronchitis    Depression    Dysrhythmia    Elevated cholesterol    Fibromyalgia    Graves disease    Hypertension    Hypothyroidism    Obesity    Polio    age 63   Prediabetes    Shortness of breath dyspnea    Status post dilation of esophageal narrowing    Stroke [facility]) 2017   UTI (lower urinary tract infection)     Past Surgical History:  Procedure Laterality Date   ABDOMINAL HYSTERECTOMY  1977   APPENDECTOMY     BREAST SURGERY Left    Lumpectomy/ Benign   CATARACT EXTRACTION W/PHACO Right 05/10/2014   Procedure: CATARACT EXTRACTION PHACO AND INTRAOCULAR LENS PLACEMENT ([place]);  Surgeon: Rutherford Guys, MD;   Location: [facility];  Service: Ophthalmology;  Laterality: Right;  CDE:6.25   CATARACT EXTRACTION W/PHACO Left 06/14/2014   Procedure: CATARACT EXTRACTION PHACO AND INTRAOCULAR LENS PLACEMENT ([facility]);  Surgeon: Rutherford Guys, MD;  Location: [facility];  Service: Ophthalmology;  Laterality: Left;  CDE:4.19   CHOLECYSTECTOMY     KNEE ARTHROPLASTY Right    REPLACEMENT TOTAL KNEE Left    TOTAL THYROIDECTOMY Bilateral    URETHRAL CYST REMOVAL      Current Facility-Administered Medications  Medication Dose Route Frequency Provider Last Rate Last Admin   0.9 %  sodium chloride infusion   Intravenous Continuous Thornton Park, MD        Allergies as of 07/04/2020 - Review Complete 07/04/2020  Allergen Reaction Noted   Biaxin [clarithromycin]  05/03/2014   Macrobid [nitrofurantoin monohyd macro] Nausea Only 05/03/2014    Family History  Problem Relation Age of Onset   Hyperlipidemia Mother    AAA (abdominal aortic aneurysm) Mother    Heart attack Mother    Heart failure Mother    Heart disease Father    AAA (abdominal aortic aneurysm) Father    Colon cancer Maternal Grandmother    Heart disease Paternal Grandmother    Colon cancer Maternal Aunt    Colon polyps Son    Hypertension Son      Physical Exam: General:   Alert,  well-nourished, pleasant and cooperative in NAD Head:  Normocephalic and atraumatic. Eyes:  Sclera clear,  no icterus.   Conjunctiva pink. Ears:  Normal auditory acuity. Nose:  No deformity, discharge,  or lesions. Mouth:  No deformity or lesions.   Neck:  Supple; no masses or thyromegaly. Lungs:  Clear throughout to auscultation.   No wheezes. Heart:  Regular rate and rhythm; no murmurs. Abdomen:  Soft, nontender, nondistended, normal bowel sounds, no rebound or guarding. No hepatosplenomegaly.   Rectal:  Deferred  Msk:  Symmetrical. No boney deformities LAD: No inguinal or umbilical LAD Extremities:  No clubbing or edema. Neurologic:  Alert and  oriented x4;   grossly nonfocal Skin:  Intact without significant lesions or rashes. Psych:  Alert and cooperative. Normal mood and affect.     Carolynn Tuley L. Tarri Glenn, MD, MPH 08/24/2020, 9:12 AM

## 2020-08-24 NOTE — Op Note (Signed)
[facility] Patient Name: Michelle Roth Procedure Date: 08/24/2020 MRN: [id number] Attending MD: Thornton Park MD, MD Date of Birth: [birth year] CSN: [id number] Age: 70 Admit Type: Outpatient Procedure:                Colonoscopy Indications:              Heme positive stool, Prior colonoscopy >10 years ago Providers:                Thornton Park MD, MD, Dulcy Fanny, Fransico Setters                            Mbumina, Technician Referring MD:              Medicines:                Monitored Anesthesia Care Complications:            No immediate complications. Estimated blood loss:                            Minimal. Estimated Blood Loss:     Estimated blood loss was minimal. Procedure:                Pre-Anesthesia Assessment:                           - Prior to the procedure, a History and Physical                            was performed, and patient medications and                            allergies were reviewed. The patient's tolerance of                            previous anesthesia was also reviewed. The risks                            and benefits of the procedure and the sedation                            options and risks were discussed with the patient.                            All questions were answered, and informed consent                            was obtained. Prior Anticoagulants: The patient has                            taken Plavix (clopidogrel), last dose was 5 days                            prior to procedure. ASA Grade Assessment: III - A  patient with severe systemic disease. After                            reviewing the risks and benefits, the patient was                            deemed in satisfactory condition to undergo the                            procedure.                           After obtaining informed consent, the colonoscope                            was passed under direct vision.  Throughout the                            procedure, the patient's blood pressure, pulse, and                            oxygen saturations were monitored continuously. The                            CF-HQ190L [id number] Olympus colonoscope was                            introduced through the anus and advanced to the 2                            cm into the ileum. The [id number] Olympus                            colonoscope was introduced through the and advanced                            to the ileum. The colonoscopy was performed with                            moderate difficulty due to inadequate bowel prep,                            dense diverticulosis in the sigmoid colon, colonic                            looping, and a redundant colon. The patient                            tolerated the procedure well. The quality of the                            bowel preparation was inadequate. The terminal  ileum, ileocecal valve, appendiceal orifice, and                            rectum were photographed. Scope In: 10:18:12 AM Scope Out: 10:54:27 AM Scope Withdrawal Time: 0 hours 22 minutes 56 seconds  Total Procedure Duration: 0 hours 36 minutes 15 seconds  Findings:      The perianal and digital rectal examinations were normal.      Multiple small and large-mouthed diverticula were found in the sigmoid       colon and distal descending colon. A pediatric colonoscope was required       to safely advance through the sigmoid colon.      Two sessile polyps were found in the rectum. The polyps were 1 to 5 mm       in size. These polyps were removed with a cold snare. Resection and       retrieval were complete. Estimated blood loss was minimal.      Seven sessile polyps were found in the sigmoid colon and descending       colon. The polyps were 3 to 14 mm in size. These polyps were removed       with a cold snare. Resection and retrieval were complete.  Estimated       blood loss was minimal.      Four sessile polyps were found in the ascending colon. The polyps were 2       to 14 mm in size. These polyps were removed with a cold snare. Resection       and retrieval were complete. Estimated blood loss was minimal.      Residual stool was present throughout the colon. In areas, this limited       a complete evaluation. The exam was otherwise without abnormality on       direct and retroflexion views. Impression:               - Preparation of the colon was inadequate.                           - Diverticulosis in the sigmoid colon and in the                            distal descending colon.                           - Two 2 to 4 mm polyps in the rectum, removed with                            a cold snare. Resected and retrieved.                           - Seven 3 to 14 mm polyps in the sigmoid colon and                            in the descending colon, removed with a cold snare.                            Resected and retrieved.                           -  Four 2 to 14 mm polyps in the ascending colon,                            removed with a cold snare. Resected and retrieved.                           - The examination was otherwise normal on direct                            and retroflexion views. Moderate Sedation:      Not Applicable - Patient had care per Anesthesia. Recommendation:           - Patient has a contact number available for                            emergencies. The signs and symptoms of potential                            delayed complications were discussed with the                            patient. Return to normal activities tomorrow.                            Written discharge instructions were provided to the                            patient.                           - High fiber diet.                           - Continue present medications.                           - Await pathology results.                            - Repeat colonoscopy in 1 year for surveillance                            given the number of polyps and the inadequate prep.                            Plan a different, more extended bowel prep at that                            time.                           - Resume Plavix (clopidogrel) at prior dose in 3                            days.                           -  Consultation with genetic counseling recommended.                           - Emerging evidence supports eating a diet of                            fruits, vegetables, grains, calcium, and yogurt                            while reducing red meat and alcohol may reduce the                            risk of colon cancer.                           - Given these results, all first degree relatives                            (brothers, sisters, children, parents) should start                            colon cancer screening at age 42. Procedure Code(s):        --- Professional ---                           [phone], Colonoscopy, flexible; with removal of                            tumor(s), polyp(s), or other lesion(s) by snare                            technique Diagnosis Code(s):        --- Professional ---                           K62.1, Rectal polyp                           K63.5, Polyp of colon                           R19.5, Other fecal abnormalities                           K57.30, Diverticulosis of large intestine without                            perforation or abscess without bleeding CPT copyright 2019 [employer]. All rights reserved. The codes documented in this report are preliminary and upon coder review may  be revised to meet current compliance requirements. Thornton Park MD, MD 08/24/2020 11:13:02 AM This report has been signed electronically. Number of Addenda: 0

## 2020-08-24 NOTE — Transfer of Care (Signed)
Immediate Anesthesia Transfer of Care Note  Patient: Michelle Roth  Procedure(s) Performed: Procedure(s): COLONOSCOPY WITH PROPOFOL (N/A) POLYPECTOMY  Patient Location: PACU and Endoscopy Unit  Anesthesia Type:MAC  Level of Consciousness: awake, alert  and oriented  Airway & Oxygen Therapy: Patient Spontanous Breathing and Patient connected to nasal cannula oxygen  Post-op Assessment: Report given to RN and Post -op Vital signs reviewed and stable  Post vital signs: Reviewed and stable  Last Vitals:  Vitals:   08/24/20 0919  BP: (!) 172/84  Pulse: 63  Resp: 19  Temp: 37 C  SpO2: 58%    Complications: No apparent anesthesia complications

## 2020-08-25 ENCOUNTER — Encounter (HOSPITAL_COMMUNITY): Payer: Self-pay | Admitting: Gastroenterology

## 2020-08-25 LAB — SURGICAL PATHOLOGY

## 2020-09-12 DIAGNOSIS — N390 Urinary tract infection, site not specified: Secondary | ICD-10-CM | POA: Diagnosis not present

## 2020-09-12 DIAGNOSIS — Z299 Encounter for prophylactic measures, unspecified: Secondary | ICD-10-CM | POA: Diagnosis not present

## 2020-09-12 DIAGNOSIS — D696 Thrombocytopenia, unspecified: Secondary | ICD-10-CM | POA: Diagnosis not present

## 2020-09-12 DIAGNOSIS — N1831 Chronic kidney disease, stage 3a: Secondary | ICD-10-CM | POA: Diagnosis not present

## 2020-10-06 ENCOUNTER — Telehealth: Payer: Self-pay | Admitting: Gastroenterology

## 2020-10-06 NOTE — Telephone Encounter (Signed)
Pt was notified of path results Path report was faxed via Epic  to pt's PCP as pt requested. Pt verbalized understanding with all questions answered.

## 2020-10-06 NOTE — Telephone Encounter (Signed)
Patient called requesting path results from August that have not been given also requested we send a copy to her PCP Dr. Woody Seller

## 2020-10-12 DIAGNOSIS — J209 Acute bronchitis, unspecified: Secondary | ICD-10-CM | POA: Diagnosis not present

## 2020-10-12 DIAGNOSIS — J440 Chronic obstructive pulmonary disease with acute lower respiratory infection: Secondary | ICD-10-CM | POA: Diagnosis not present

## 2020-10-12 DIAGNOSIS — Z299 Encounter for prophylactic measures, unspecified: Secondary | ICD-10-CM | POA: Diagnosis not present

## 2020-10-12 DIAGNOSIS — R1110 Vomiting, unspecified: Secondary | ICD-10-CM | POA: Diagnosis not present

## 2020-10-13 DIAGNOSIS — F32A Depression, unspecified: Secondary | ICD-10-CM | POA: Diagnosis not present

## 2020-10-13 DIAGNOSIS — I10 Essential (primary) hypertension: Secondary | ICD-10-CM | POA: Diagnosis not present

## 2020-11-17 DIAGNOSIS — R1110 Vomiting, unspecified: Secondary | ICD-10-CM | POA: Diagnosis not present

## 2020-11-17 DIAGNOSIS — J069 Acute upper respiratory infection, unspecified: Secondary | ICD-10-CM | POA: Diagnosis not present

## 2020-11-17 DIAGNOSIS — E162 Hypoglycemia, unspecified: Secondary | ICD-10-CM | POA: Diagnosis not present

## 2020-11-17 DIAGNOSIS — Z299 Encounter for prophylactic measures, unspecified: Secondary | ICD-10-CM | POA: Diagnosis not present

## 2021-01-10 DIAGNOSIS — Z299 Encounter for prophylactic measures, unspecified: Secondary | ICD-10-CM | POA: Diagnosis not present

## 2021-01-10 DIAGNOSIS — N390 Urinary tract infection, site not specified: Secondary | ICD-10-CM | POA: Diagnosis not present

## 2021-01-10 DIAGNOSIS — J069 Acute upper respiratory infection, unspecified: Secondary | ICD-10-CM | POA: Diagnosis not present

## 2021-01-10 DIAGNOSIS — U071 COVID-19: Secondary | ICD-10-CM | POA: Diagnosis not present

## 2021-04-27 DIAGNOSIS — Z0000 Encounter for general adult medical examination without abnormal findings: Secondary | ICD-10-CM | POA: Diagnosis not present

## 2021-04-27 DIAGNOSIS — Z7189 Other specified counseling: Secondary | ICD-10-CM | POA: Diagnosis not present

## 2021-04-27 DIAGNOSIS — R5383 Other fatigue: Secondary | ICD-10-CM | POA: Diagnosis not present

## 2021-04-27 DIAGNOSIS — Z299 Encounter for prophylactic measures, unspecified: Secondary | ICD-10-CM | POA: Diagnosis not present

## 2021-04-27 DIAGNOSIS — Z789 Other specified health status: Secondary | ICD-10-CM | POA: Diagnosis not present

## 2021-04-27 DIAGNOSIS — E039 Hypothyroidism, unspecified: Secondary | ICD-10-CM | POA: Diagnosis not present

## 2021-04-27 DIAGNOSIS — I10 Essential (primary) hypertension: Secondary | ICD-10-CM | POA: Diagnosis not present

## 2021-05-14 DIAGNOSIS — E039 Hypothyroidism, unspecified: Secondary | ICD-10-CM | POA: Diagnosis not present

## 2021-05-14 DIAGNOSIS — R5383 Other fatigue: Secondary | ICD-10-CM | POA: Diagnosis not present

## 2021-05-14 DIAGNOSIS — E7800 Pure hypercholesterolemia, unspecified: Secondary | ICD-10-CM | POA: Diagnosis not present

## 2021-05-14 DIAGNOSIS — Z79899 Other long term (current) drug therapy: Secondary | ICD-10-CM | POA: Diagnosis not present

## 2021-05-24 DIAGNOSIS — E7800 Pure hypercholesterolemia, unspecified: Secondary | ICD-10-CM | POA: Diagnosis not present

## 2021-05-24 DIAGNOSIS — E039 Hypothyroidism, unspecified: Secondary | ICD-10-CM | POA: Diagnosis not present

## 2021-05-24 DIAGNOSIS — Z79899 Other long term (current) drug therapy: Secondary | ICD-10-CM | POA: Diagnosis not present

## 2021-05-24 DIAGNOSIS — R5383 Other fatigue: Secondary | ICD-10-CM | POA: Diagnosis not present

## 2021-05-30 DIAGNOSIS — I10 Essential (primary) hypertension: Secondary | ICD-10-CM | POA: Diagnosis not present

## 2021-05-30 DIAGNOSIS — E039 Hypothyroidism, unspecified: Secondary | ICD-10-CM | POA: Diagnosis not present

## 2021-05-30 DIAGNOSIS — Z299 Encounter for prophylactic measures, unspecified: Secondary | ICD-10-CM | POA: Diagnosis not present

## 2021-05-30 DIAGNOSIS — D696 Thrombocytopenia, unspecified: Secondary | ICD-10-CM | POA: Diagnosis not present

## 2021-06-07 DIAGNOSIS — D696 Thrombocytopenia, unspecified: Secondary | ICD-10-CM | POA: Diagnosis not present

## 2021-06-07 DIAGNOSIS — K7689 Other specified diseases of liver: Secondary | ICD-10-CM | POA: Diagnosis not present

## 2021-07-27 DIAGNOSIS — Z299 Encounter for prophylactic measures, unspecified: Secondary | ICD-10-CM | POA: Diagnosis not present

## 2021-07-27 DIAGNOSIS — I10 Essential (primary) hypertension: Secondary | ICD-10-CM | POA: Diagnosis not present

## 2021-09-21 ENCOUNTER — Encounter: Payer: Self-pay | Admitting: Gastroenterology

## 2021-10-31 DIAGNOSIS — I10 Essential (primary) hypertension: Secondary | ICD-10-CM | POA: Diagnosis not present

## 2022-04-04 DIAGNOSIS — I10 Essential (primary) hypertension: Secondary | ICD-10-CM | POA: Diagnosis not present

## 2022-04-04 DIAGNOSIS — D696 Thrombocytopenia, unspecified: Secondary | ICD-10-CM | POA: Diagnosis not present

## 2022-05-16 DIAGNOSIS — R413 Other amnesia: Secondary | ICD-10-CM | POA: Diagnosis not present

## 2022-05-16 DIAGNOSIS — R5383 Other fatigue: Secondary | ICD-10-CM | POA: Diagnosis not present

## 2022-05-16 DIAGNOSIS — I10 Essential (primary) hypertension: Secondary | ICD-10-CM | POA: Diagnosis not present

## 2022-05-16 DIAGNOSIS — Z7189 Other specified counseling: Secondary | ICD-10-CM | POA: Diagnosis not present

## 2022-05-16 DIAGNOSIS — E7800 Pure hypercholesterolemia, unspecified: Secondary | ICD-10-CM | POA: Diagnosis not present

## 2022-05-16 DIAGNOSIS — Z299 Encounter for prophylactic measures, unspecified: Secondary | ICD-10-CM | POA: Diagnosis not present

## 2022-05-16 DIAGNOSIS — Z0000 Encounter for general adult medical examination without abnormal findings: Secondary | ICD-10-CM | POA: Diagnosis not present

## 2022-05-16 DIAGNOSIS — Z79899 Other long term (current) drug therapy: Secondary | ICD-10-CM | POA: Diagnosis not present

## 2022-05-16 DIAGNOSIS — Z87891 Personal history of nicotine dependence: Secondary | ICD-10-CM | POA: Diagnosis not present

## 2022-11-29 ENCOUNTER — Telehealth: Payer: Self-pay

## 2022-11-29 DIAGNOSIS — I10 Essential (primary) hypertension: Secondary | ICD-10-CM | POA: Diagnosis not present

## 2022-11-29 DIAGNOSIS — R4781 Slurred speech: Secondary | ICD-10-CM | POA: Diagnosis not present

## 2022-11-29 DIAGNOSIS — J9811 Atelectasis: Secondary | ICD-10-CM | POA: Diagnosis not present

## 2022-11-29 DIAGNOSIS — I6789 Other cerebrovascular disease: Secondary | ICD-10-CM | POA: Diagnosis not present

## 2022-11-29 DIAGNOSIS — R220 Localized swelling, mass and lump, head: Secondary | ICD-10-CM | POA: Diagnosis not present

## 2022-11-29 DIAGNOSIS — Z9282 Status post administration of tPA (rtPA) in a different facility within the last 24 hours prior to admission to current facility: Secondary | ICD-10-CM | POA: Diagnosis not present

## 2022-11-29 DIAGNOSIS — G460 Middle cerebral artery syndrome: Secondary | ICD-10-CM | POA: Diagnosis not present

## 2022-11-29 DIAGNOSIS — L7602 Intraoperative hemorrhage and hematoma of skin and subcutaneous tissue complicating other procedure: Secondary | ICD-10-CM | POA: Diagnosis not present

## 2022-11-29 DIAGNOSIS — J3489 Other specified disorders of nose and nasal sinuses: Secondary | ICD-10-CM | POA: Diagnosis not present

## 2022-11-29 DIAGNOSIS — Z7409 Other reduced mobility: Secondary | ICD-10-CM | POA: Diagnosis not present

## 2022-11-29 DIAGNOSIS — I618 Other nontraumatic intracerebral hemorrhage: Secondary | ICD-10-CM | POA: Diagnosis not present

## 2022-11-29 DIAGNOSIS — I672 Cerebral atherosclerosis: Secondary | ICD-10-CM | POA: Diagnosis not present

## 2022-11-29 DIAGNOSIS — J90 Pleural effusion, not elsewhere classified: Secondary | ICD-10-CM | POA: Diagnosis not present

## 2022-11-29 DIAGNOSIS — R9082 White matter disease, unspecified: Secondary | ICD-10-CM | POA: Diagnosis not present

## 2022-11-29 DIAGNOSIS — R569 Unspecified convulsions: Secondary | ICD-10-CM | POA: Diagnosis not present

## 2022-11-29 DIAGNOSIS — Z978 Presence of other specified devices: Secondary | ICD-10-CM | POA: Diagnosis not present

## 2022-11-29 DIAGNOSIS — J690 Pneumonitis due to inhalation of food and vomit: Secondary | ICD-10-CM | POA: Diagnosis not present

## 2022-11-29 DIAGNOSIS — R531 Weakness: Secondary | ICD-10-CM | POA: Diagnosis not present

## 2022-11-29 DIAGNOSIS — Z7902 Long term (current) use of antithrombotics/antiplatelets: Secondary | ICD-10-CM | POA: Diagnosis not present

## 2022-11-29 DIAGNOSIS — J986 Disorders of diaphragm: Secondary | ICD-10-CM | POA: Diagnosis not present

## 2022-11-29 DIAGNOSIS — I080 Rheumatic disorders of both mitral and aortic valves: Secondary | ICD-10-CM | POA: Diagnosis not present

## 2022-11-29 DIAGNOSIS — J9601 Acute respiratory failure with hypoxia: Secondary | ICD-10-CM | POA: Diagnosis not present

## 2022-11-29 DIAGNOSIS — K8689 Other specified diseases of pancreas: Secondary | ICD-10-CM | POA: Diagnosis not present

## 2022-11-29 DIAGNOSIS — E870 Hyperosmolality and hypernatremia: Secondary | ICD-10-CM | POA: Diagnosis not present

## 2022-11-29 DIAGNOSIS — R930 Abnormal findings on diagnostic imaging of skull and head, not elsewhere classified: Secondary | ICD-10-CM | POA: Diagnosis not present

## 2022-11-29 DIAGNOSIS — K5730 Diverticulosis of large intestine without perforation or abscess without bleeding: Secondary | ICD-10-CM | POA: Diagnosis not present

## 2022-11-29 DIAGNOSIS — I6502 Occlusion and stenosis of left vertebral artery: Secondary | ICD-10-CM | POA: Diagnosis not present

## 2022-11-29 DIAGNOSIS — I615 Nontraumatic intracerebral hemorrhage, intraventricular: Secondary | ICD-10-CM | POA: Diagnosis not present

## 2022-11-29 DIAGNOSIS — R451 Restlessness and agitation: Secondary | ICD-10-CM | POA: Diagnosis not present

## 2022-11-29 DIAGNOSIS — E785 Hyperlipidemia, unspecified: Secondary | ICD-10-CM | POA: Diagnosis not present

## 2022-11-29 DIAGNOSIS — G935 Compression of brain: Secondary | ICD-10-CM | POA: Diagnosis not present

## 2022-11-29 DIAGNOSIS — G936 Cerebral edema: Secondary | ICD-10-CM | POA: Diagnosis not present

## 2022-11-29 DIAGNOSIS — G8191 Hemiplegia, unspecified affecting right dominant side: Secondary | ICD-10-CM | POA: Diagnosis not present

## 2022-11-29 DIAGNOSIS — I2510 Atherosclerotic heart disease of native coronary artery without angina pectoris: Secondary | ICD-10-CM | POA: Diagnosis not present

## 2022-11-29 DIAGNOSIS — K7460 Unspecified cirrhosis of liver: Secondary | ICD-10-CM | POA: Diagnosis not present

## 2022-11-29 DIAGNOSIS — R58 Hemorrhage, not elsewhere classified: Secondary | ICD-10-CM | POA: Diagnosis not present

## 2022-11-29 DIAGNOSIS — Z4682 Encounter for fitting and adjustment of non-vascular catheter: Secondary | ICD-10-CM | POA: Diagnosis not present

## 2022-11-29 DIAGNOSIS — Z743 Need for continuous supervision: Secondary | ICD-10-CM | POA: Diagnosis not present

## 2022-11-29 DIAGNOSIS — Z471 Aftercare following joint replacement surgery: Secondary | ICD-10-CM | POA: Diagnosis not present

## 2022-11-29 DIAGNOSIS — G238 Other specified degenerative diseases of basal ganglia: Secondary | ICD-10-CM | POA: Diagnosis not present

## 2022-11-29 DIAGNOSIS — R404 Transient alteration of awareness: Secondary | ICD-10-CM | POA: Diagnosis not present

## 2022-11-29 DIAGNOSIS — R059 Cough, unspecified: Secondary | ICD-10-CM | POA: Diagnosis not present

## 2022-11-29 DIAGNOSIS — G9389 Other specified disorders of brain: Secondary | ICD-10-CM | POA: Diagnosis not present

## 2022-11-29 DIAGNOSIS — Q283 Other malformations of cerebral vessels: Secondary | ICD-10-CM | POA: Diagnosis not present

## 2022-11-29 DIAGNOSIS — R4020 Unspecified coma: Secondary | ICD-10-CM | POA: Diagnosis not present

## 2022-11-29 DIAGNOSIS — R29810 Facial weakness: Secondary | ICD-10-CM | POA: Diagnosis not present

## 2022-11-29 DIAGNOSIS — R0603 Acute respiratory distress: Secondary | ICD-10-CM | POA: Diagnosis not present

## 2022-11-29 DIAGNOSIS — I639 Cerebral infarction, unspecified: Secondary | ICD-10-CM | POA: Diagnosis not present

## 2022-11-29 DIAGNOSIS — I6389 Other cerebral infarction: Secondary | ICD-10-CM | POA: Diagnosis not present

## 2022-11-29 DIAGNOSIS — I63512 Cerebral infarction due to unspecified occlusion or stenosis of left middle cerebral artery: Secondary | ICD-10-CM | POA: Diagnosis not present

## 2022-11-29 DIAGNOSIS — N179 Acute kidney failure, unspecified: Secondary | ICD-10-CM | POA: Diagnosis not present

## 2022-11-29 DIAGNOSIS — R918 Other nonspecific abnormal finding of lung field: Secondary | ICD-10-CM | POA: Diagnosis not present

## 2022-11-29 DIAGNOSIS — I6523 Occlusion and stenosis of bilateral carotid arteries: Secondary | ICD-10-CM | POA: Diagnosis not present

## 2022-11-29 DIAGNOSIS — D62 Acute posthemorrhagic anemia: Secondary | ICD-10-CM | POA: Diagnosis not present

## 2022-11-29 DIAGNOSIS — E1165 Type 2 diabetes mellitus with hyperglycemia: Secondary | ICD-10-CM | POA: Diagnosis not present

## 2022-11-29 DIAGNOSIS — I63312 Cerebral infarction due to thrombosis of left middle cerebral artery: Secondary | ICD-10-CM | POA: Diagnosis not present

## 2022-11-29 DIAGNOSIS — K118 Other diseases of salivary glands: Secondary | ICD-10-CM | POA: Diagnosis not present

## 2022-11-29 DIAGNOSIS — I517 Cardiomegaly: Secondary | ICD-10-CM | POA: Diagnosis not present

## 2022-11-29 DIAGNOSIS — R6889 Other general symptoms and signs: Secondary | ICD-10-CM | POA: Diagnosis not present

## 2022-11-29 DIAGNOSIS — R600 Localized edema: Secondary | ICD-10-CM | POA: Diagnosis not present

## 2022-11-29 DIAGNOSIS — K6289 Other specified diseases of anus and rectum: Secondary | ICD-10-CM | POA: Diagnosis not present

## 2022-11-29 DIAGNOSIS — R29735 NIHSS score 35: Secondary | ICD-10-CM | POA: Diagnosis not present

## 2022-11-29 DIAGNOSIS — K449 Diaphragmatic hernia without obstruction or gangrene: Secondary | ICD-10-CM | POA: Diagnosis not present

## 2022-11-29 DIAGNOSIS — I63412 Cerebral infarction due to embolism of left middle cerebral artery: Secondary | ICD-10-CM | POA: Diagnosis not present

## 2022-11-29 NOTE — Patient Outreach (Signed)
[facility] Assistant attempted to call patient on today regarding preventative mammogram screening. No answer from patient after multiple rings. Assistant unable to leave confidential voicemail for patient to return call.  Will call back patient back for final attempt.  Baruch Gouty [facility] Assistant [facility] [phone]

## 2022-11-30 DIAGNOSIS — I639 Cerebral infarction, unspecified: Secondary | ICD-10-CM | POA: Diagnosis not present

## 2022-11-30 DIAGNOSIS — I63512 Cerebral infarction due to unspecified occlusion or stenosis of left middle cerebral artery: Secondary | ICD-10-CM | POA: Diagnosis not present

## 2022-11-30 DIAGNOSIS — Z4682 Encounter for fitting and adjustment of non-vascular catheter: Secondary | ICD-10-CM | POA: Diagnosis not present

## 2022-11-30 DIAGNOSIS — R918 Other nonspecific abnormal finding of lung field: Secondary | ICD-10-CM | POA: Diagnosis not present

## 2022-11-30 DIAGNOSIS — J986 Disorders of diaphragm: Secondary | ICD-10-CM | POA: Diagnosis not present

## 2022-11-30 DIAGNOSIS — R9082 White matter disease, unspecified: Secondary | ICD-10-CM | POA: Diagnosis not present

## 2022-11-30 DIAGNOSIS — G9389 Other specified disorders of brain: Secondary | ICD-10-CM | POA: Diagnosis not present

## 2022-11-30 DIAGNOSIS — J90 Pleural effusion, not elsewhere classified: Secondary | ICD-10-CM | POA: Diagnosis not present

## 2022-11-30 DIAGNOSIS — R569 Unspecified convulsions: Secondary | ICD-10-CM | POA: Diagnosis not present

## 2022-11-30 DIAGNOSIS — I615 Nontraumatic intracerebral hemorrhage, intraventricular: Secondary | ICD-10-CM | POA: Diagnosis not present

## 2022-11-30 DIAGNOSIS — Z978 Presence of other specified devices: Secondary | ICD-10-CM | POA: Diagnosis not present

## 2022-11-30 DIAGNOSIS — R600 Localized edema: Secondary | ICD-10-CM | POA: Diagnosis not present

## 2022-11-30 DIAGNOSIS — G936 Cerebral edema: Secondary | ICD-10-CM | POA: Diagnosis not present

## 2022-12-01 DIAGNOSIS — J3489 Other specified disorders of nose and nasal sinuses: Secondary | ICD-10-CM | POA: Diagnosis not present

## 2022-12-01 DIAGNOSIS — I517 Cardiomegaly: Secondary | ICD-10-CM | POA: Diagnosis not present

## 2022-12-01 DIAGNOSIS — I615 Nontraumatic intracerebral hemorrhage, intraventricular: Secondary | ICD-10-CM | POA: Diagnosis not present

## 2022-12-01 DIAGNOSIS — K7460 Unspecified cirrhosis of liver: Secondary | ICD-10-CM | POA: Diagnosis not present

## 2022-12-01 DIAGNOSIS — R569 Unspecified convulsions: Secondary | ICD-10-CM | POA: Diagnosis not present

## 2022-12-01 DIAGNOSIS — R059 Cough, unspecified: Secondary | ICD-10-CM | POA: Diagnosis not present

## 2022-12-01 DIAGNOSIS — K6289 Other specified diseases of anus and rectum: Secondary | ICD-10-CM | POA: Diagnosis not present

## 2022-12-01 DIAGNOSIS — I63512 Cerebral infarction due to unspecified occlusion or stenosis of left middle cerebral artery: Secondary | ICD-10-CM | POA: Diagnosis not present

## 2022-12-01 DIAGNOSIS — G238 Other specified degenerative diseases of basal ganglia: Secondary | ICD-10-CM | POA: Diagnosis not present

## 2022-12-01 DIAGNOSIS — K8689 Other specified diseases of pancreas: Secondary | ICD-10-CM | POA: Diagnosis not present

## 2022-12-01 DIAGNOSIS — J9811 Atelectasis: Secondary | ICD-10-CM | POA: Diagnosis not present

## 2022-12-01 DIAGNOSIS — K5730 Diverticulosis of large intestine without perforation or abscess without bleeding: Secondary | ICD-10-CM | POA: Diagnosis not present

## 2022-12-01 DIAGNOSIS — R918 Other nonspecific abnormal finding of lung field: Secondary | ICD-10-CM | POA: Diagnosis not present

## 2022-12-01 DIAGNOSIS — I2510 Atherosclerotic heart disease of native coronary artery without angina pectoris: Secondary | ICD-10-CM | POA: Diagnosis not present

## 2022-12-01 DIAGNOSIS — K449 Diaphragmatic hernia without obstruction or gangrene: Secondary | ICD-10-CM | POA: Diagnosis not present

## 2022-12-01 DIAGNOSIS — I639 Cerebral infarction, unspecified: Secondary | ICD-10-CM | POA: Diagnosis not present

## 2022-12-01 DIAGNOSIS — R531 Weakness: Secondary | ICD-10-CM | POA: Diagnosis not present

## 2022-12-02 DIAGNOSIS — I10 Essential (primary) hypertension: Secondary | ICD-10-CM | POA: Diagnosis not present

## 2022-12-02 DIAGNOSIS — R918 Other nonspecific abnormal finding of lung field: Secondary | ICD-10-CM | POA: Diagnosis not present

## 2022-12-02 DIAGNOSIS — I080 Rheumatic disorders of both mitral and aortic valves: Secondary | ICD-10-CM | POA: Diagnosis not present

## 2022-12-02 DIAGNOSIS — I639 Cerebral infarction, unspecified: Secondary | ICD-10-CM | POA: Diagnosis not present

## 2022-12-02 DIAGNOSIS — G936 Cerebral edema: Secondary | ICD-10-CM | POA: Diagnosis not present

## 2022-12-02 DIAGNOSIS — Z7409 Other reduced mobility: Secondary | ICD-10-CM | POA: Diagnosis not present

## 2022-12-02 DIAGNOSIS — I615 Nontraumatic intracerebral hemorrhage, intraventricular: Secondary | ICD-10-CM | POA: Diagnosis not present

## 2022-12-02 DIAGNOSIS — J690 Pneumonitis due to inhalation of food and vomit: Secondary | ICD-10-CM | POA: Diagnosis not present

## 2022-12-02 DIAGNOSIS — J9601 Acute respiratory failure with hypoxia: Secondary | ICD-10-CM | POA: Diagnosis not present

## 2022-12-02 DIAGNOSIS — E1165 Type 2 diabetes mellitus with hyperglycemia: Secondary | ICD-10-CM | POA: Diagnosis not present

## 2022-12-02 DIAGNOSIS — G935 Compression of brain: Secondary | ICD-10-CM | POA: Diagnosis not present

## 2022-12-03 DIAGNOSIS — I615 Nontraumatic intracerebral hemorrhage, intraventricular: Secondary | ICD-10-CM | POA: Diagnosis not present

## 2022-12-03 DIAGNOSIS — G936 Cerebral edema: Secondary | ICD-10-CM | POA: Diagnosis not present

## 2022-12-03 DIAGNOSIS — Z7409 Other reduced mobility: Secondary | ICD-10-CM | POA: Diagnosis not present

## 2022-12-03 DIAGNOSIS — G935 Compression of brain: Secondary | ICD-10-CM | POA: Diagnosis not present

## 2022-12-03 DIAGNOSIS — I639 Cerebral infarction, unspecified: Secondary | ICD-10-CM | POA: Diagnosis not present

## 2022-12-04 DIAGNOSIS — I615 Nontraumatic intracerebral hemorrhage, intraventricular: Secondary | ICD-10-CM | POA: Diagnosis not present

## 2022-12-04 DIAGNOSIS — Z7409 Other reduced mobility: Secondary | ICD-10-CM | POA: Diagnosis not present

## 2022-12-04 DIAGNOSIS — I639 Cerebral infarction, unspecified: Secondary | ICD-10-CM | POA: Diagnosis not present

## 2022-12-04 DIAGNOSIS — G935 Compression of brain: Secondary | ICD-10-CM | POA: Diagnosis not present

## 2022-12-04 DIAGNOSIS — G936 Cerebral edema: Secondary | ICD-10-CM | POA: Diagnosis not present

## 2022-12-05 DIAGNOSIS — J690 Pneumonitis due to inhalation of food and vomit: Secondary | ICD-10-CM | POA: Diagnosis not present

## 2022-12-05 DIAGNOSIS — I672 Cerebral atherosclerosis: Secondary | ICD-10-CM | POA: Diagnosis not present

## 2022-12-05 DIAGNOSIS — G935 Compression of brain: Secondary | ICD-10-CM | POA: Diagnosis not present

## 2022-12-05 DIAGNOSIS — G936 Cerebral edema: Secondary | ICD-10-CM | POA: Diagnosis not present

## 2022-12-05 DIAGNOSIS — I639 Cerebral infarction, unspecified: Secondary | ICD-10-CM | POA: Diagnosis not present

## 2022-12-05 DIAGNOSIS — I615 Nontraumatic intracerebral hemorrhage, intraventricular: Secondary | ICD-10-CM | POA: Diagnosis not present

## 2022-12-05 DIAGNOSIS — E1165 Type 2 diabetes mellitus with hyperglycemia: Secondary | ICD-10-CM | POA: Diagnosis not present

## 2022-12-05 DIAGNOSIS — Z7409 Other reduced mobility: Secondary | ICD-10-CM | POA: Diagnosis not present

## 2022-12-05 DIAGNOSIS — J9601 Acute respiratory failure with hypoxia: Secondary | ICD-10-CM | POA: Diagnosis not present

## 2022-12-06 DIAGNOSIS — I639 Cerebral infarction, unspecified: Secondary | ICD-10-CM | POA: Diagnosis not present

## 2022-12-06 DIAGNOSIS — G935 Compression of brain: Secondary | ICD-10-CM | POA: Diagnosis not present

## 2022-12-06 DIAGNOSIS — I615 Nontraumatic intracerebral hemorrhage, intraventricular: Secondary | ICD-10-CM | POA: Diagnosis not present

## 2022-12-06 DIAGNOSIS — I672 Cerebral atherosclerosis: Secondary | ICD-10-CM | POA: Diagnosis not present

## 2022-12-06 DIAGNOSIS — G936 Cerebral edema: Secondary | ICD-10-CM | POA: Diagnosis not present

## 2022-12-06 DIAGNOSIS — E1165 Type 2 diabetes mellitus with hyperglycemia: Secondary | ICD-10-CM | POA: Diagnosis not present

## 2022-12-06 DIAGNOSIS — Z7409 Other reduced mobility: Secondary | ICD-10-CM | POA: Diagnosis not present

## 2022-12-06 DIAGNOSIS — J9601 Acute respiratory failure with hypoxia: Secondary | ICD-10-CM | POA: Diagnosis not present

## 2022-12-06 DIAGNOSIS — J690 Pneumonitis due to inhalation of food and vomit: Secondary | ICD-10-CM | POA: Diagnosis not present

## 2022-12-15 DEATH — deceased
